# Patient Record
Sex: Male | Born: 1980 | Race: White | Hispanic: No | Marital: Single | Smoking: Current every day smoker
Health system: Southern US, Community
[De-identification: ages and names within clinical notes are randomized; demographics above are authoritative.]

## PROBLEM LIST (undated history)

## (undated) DIAGNOSIS — F1010 Alcohol abuse, uncomplicated: Secondary | ICD-10-CM

## (undated) DIAGNOSIS — T1491XA Suicide attempt, initial encounter: Secondary | ICD-10-CM

## (undated) DIAGNOSIS — F1210 Cannabis abuse, uncomplicated: Secondary | ICD-10-CM

## (undated) DIAGNOSIS — K859 Acute pancreatitis without necrosis or infection, unspecified: Secondary | ICD-10-CM

---
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
## 2012-09-12 DIAGNOSIS — R109 Unspecified abdominal pain: Secondary | ICD-10-CM

## 2012-11-10 ENCOUNTER — Encounter (HOSPITAL_COMMUNITY): Payer: Self-pay

## 2012-11-10 ENCOUNTER — Emergency Department (HOSPITAL_COMMUNITY)
Admission: EM | Admit: 2012-11-10 | Discharge: 2012-11-10 | Disposition: A | Discharge status: Home / Self Care | Payer: Self-pay | Attending: Emergency Medicine | Admitting: Emergency Medicine

## 2012-11-10 ENCOUNTER — Emergency Department (HOSPITAL_COMMUNITY): Payer: Self-pay

## 2012-11-10 DIAGNOSIS — F1010 Alcohol abuse, uncomplicated: Secondary | ICD-10-CM

## 2012-11-10 DIAGNOSIS — M549 Dorsalgia, unspecified: Secondary | ICD-10-CM | POA: Insufficient documentation

## 2012-11-10 DIAGNOSIS — K861 Other chronic pancreatitis: Secondary | ICD-10-CM | POA: Insufficient documentation

## 2012-11-10 DIAGNOSIS — R000 Tachycardia, unspecified: Secondary | ICD-10-CM | POA: Insufficient documentation

## 2012-11-10 DIAGNOSIS — K859 Acute pancreatitis without necrosis or infection, unspecified: Secondary | ICD-10-CM

## 2012-11-10 DIAGNOSIS — F17200 Nicotine dependence, unspecified, uncomplicated: Secondary | ICD-10-CM | POA: Insufficient documentation

## 2012-11-10 DIAGNOSIS — IMO0001 Reserved for inherently not codable concepts without codable children: Secondary | ICD-10-CM | POA: Insufficient documentation

## 2012-11-10 DIAGNOSIS — R112 Nausea with vomiting, unspecified: Secondary | ICD-10-CM | POA: Insufficient documentation

## 2012-11-10 DIAGNOSIS — R109 Unspecified abdominal pain: Secondary | ICD-10-CM | POA: Insufficient documentation

## 2012-11-10 DIAGNOSIS — K5900 Constipation, unspecified: Secondary | ICD-10-CM | POA: Insufficient documentation

## 2012-11-10 HISTORY — DX: Acute pancreatitis without necrosis or infection, unspecified: K85.90

## 2012-11-10 LAB — COMPREHENSIVE METABOLIC PANEL
ALT: 48 U/L — ABNORMAL HIGH (ref 0–35)
AST: 149 U/L — ABNORMAL HIGH (ref 0–37)
Albumin: 4.5 g/dL (ref 3.5–5.2)
Alkaline Phosphatase: 103 U/L (ref 39–117)
CO2: 35 mEq/L — ABNORMAL HIGH (ref 19–32)
Chloride: 97 mEq/L (ref 96–112)
GFR calc non Af Amer: >90 mL/min (ref >90)
Potassium: 2.8 mEq/L — ABNORMAL LOW (ref 3.5–5.1)
Sodium: 144 mEq/L (ref 135–145)
Total Bilirubin: 1 mg/dL (ref 0.3–1.2)

## 2012-11-10 LAB — CBC WITH DIFFERENTIAL/PLATELET
Basophils Absolute: 0.1 10*3/uL (ref 0.0–0.1)
HCT: 41.7 % (ref 36.0–46.0)
Lymphocytes Relative: 54 % — ABNORMAL HIGH (ref 12–46)
Monocytes Absolute: 0.3 10*3/uL (ref 0.1–1.0)
Neutro Abs: 1.8 10*3/uL (ref 1.7–7.7)
Neutrophils Relative %: 35 % — ABNORMAL LOW (ref 43–77)
RDW: 12.9 % (ref 11.5–15.5)
WBC: 5.1 10*3/uL (ref 4.0–10.5)

## 2012-11-10 MED ORDER — SODIUM CHLORIDE 0.9 % IV BOLUS (SEPSIS)
1000.0000 mL | Freq: Once | INTRAVENOUS | Status: AC
  Administered 2012-11-10: 1000 mL via INTRAVENOUS

## 2012-11-10 MED ORDER — PROMETHAZINE HCL 25 MG PO TABS: 25.0000 mg | ORAL_TABLET | Freq: Four times a day (QID) | ORAL | Status: DC | PRN

## 2012-11-10 MED ORDER — THIAMINE HCL 100 MG/ML IJ SOLN
100.0000 mg | Freq: Once | INTRAMUSCULAR | Status: AC
  Administered 2012-11-10: 100 mg via INTRAVENOUS
  Filled 2012-11-10: qty 2

## 2012-11-10 MED ORDER — SODIUM CHLORIDE 0.9 % IV SOLN: 1000.0000 mL | INTRAVENOUS | Status: DC

## 2012-11-10 MED ORDER — RANITIDINE HCL 150 MG PO TABS: 150.0000 mg | ORAL_TABLET | Freq: Two times a day (BID) | ORAL | Status: DC

## 2012-11-10 MED ORDER — PROCHLORPERAZINE EDISYLATE 5 MG/ML IJ SOLN
10.0000 mg | Freq: Once | INTRAMUSCULAR | Status: AC
  Administered 2012-11-10: 10 mg via INTRAVENOUS
  Filled 2012-11-10: qty 2

## 2012-11-10 MED ORDER — SODIUM CHLORIDE 0.9 % IV SOLN: 1000.0000 mL | Freq: Once | INTRAVENOUS | Status: DC

## 2012-11-10 MED ORDER — HYDROCODONE-ACETAMINOPHEN 5-325 MG PO TABS: 1.0000 | ORAL_TABLET | ORAL | Status: DC | PRN

## 2012-11-10 MED ORDER — OMEPRAZOLE 20 MG PO CPDR: 20.0000 mg | DELAYED_RELEASE_CAPSULE | Freq: Every day | ORAL | Status: DC

## 2012-11-10 MED ORDER — LORAZEPAM 2 MG/ML IJ SOLN
1.0000 mg | Freq: Once | INTRAMUSCULAR | Status: AC
  Administered 2012-11-10: 1 mg via INTRAVENOUS
  Filled 2012-11-10: qty 1

## 2012-11-10 MED ORDER — POTASSIUM CHLORIDE CRYS ER 20 MEQ PO TBCR
40.0000 meq | EXTENDED_RELEASE_TABLET | Freq: Once | ORAL | Status: AC
  Administered 2012-11-10: 40 meq via ORAL
  Filled 2012-11-10: qty 2

## 2012-11-10 MED ORDER — FENTANYL CITRATE 0.05 MG/ML IJ SOLN
50.0000 ug | Freq: Once | INTRAMUSCULAR | Status: AC
  Administered 2012-11-10: 50 ug via INTRAVENOUS
  Filled 2012-11-10: qty 2
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
## 2012-11-10 NOTE — ED Provider Notes (Signed)
History     CSN: [id number]  Arrival date & time 11/10/12  4098   First MD Initiated Contact with Patient 11/10/12 [phone]      Chief Complaint  Patient presents with  . Pancreatitis    (Consider location/radiation/quality/duration/timing/severity/associated sxs/prior treatment) HPI Comments: Pt states he drinks a lot or alcohol. Pain worse during these episodes.  Patient is a 32 y.o. male presenting with abdominal pain. The history is provided by the patient.  Abdominal Pain This is a chronic problem. The current episode started more than 1 month ago. The problem occurs intermittently. Associated symptoms include abdominal pain, myalgias, nausea and vomiting. Pertinent negatives include no arthralgias, chest pain, coughing or neck pain. Associated symptoms comments: constipation. The symptoms are aggravated by drinking, eating and walking. She has tried nothing for the symptoms. The treatment provided no relief.    Past Medical History  Diagnosis Date  . Pancreatitis     History reviewed. No pertinent past surgical history.  No family history on file.  History  Substance Use Topics  . Smoking status: Current Every Day Smoker    Types: Cigarettes  . Smokeless tobacco: Not on file  . Alcohol Use: Yes     Comment: daily    OB History   Grav Para Term Preterm Abortions TAB SAB Ect Mult Living                  Review of Systems  Constitutional: Negative for activity change.       All ROS Neg except as noted in HPI  HENT: Negative for nosebleeds and neck pain.   Eyes: Negative for photophobia and discharge.  Respiratory: Negative for cough, shortness of breath and wheezing.   Cardiovascular: Negative for chest pain and palpitations.  Gastrointestinal: Positive for nausea, vomiting, abdominal pain and constipation. Negative for blood in stool.  Genitourinary: Negative for dysuria, frequency and hematuria.  Musculoskeletal: Positive for myalgias and back pain. Negative  for arthralgias.  Skin: Negative.   Neurological: Negative for dizziness, seizures and speech difficulty.  Psychiatric/Behavioral: Negative for hallucinations and confusion.    Allergies  Review of patient's allergies indicates no known allergies.  Home Medications  No current outpatient prescriptions on file.  BP 108/68  Pulse 108  Temp(Src) 98.3 F (36.8 C) (Oral)  Resp 18  Ht 6\' 2"  (1.88 m)  Wt 160 lb (72.576 kg)  BMI 20.53 kg/m2  SpO2 93%  Physical Exam  Cardiovascular: Tachycardia present.   Abdominal: She exhibits no ascites and no mass. Bowel sounds are decreased. There is tenderness in the right upper quadrant and epigastric area. There is guarding and CVA tenderness.    ED Course  Procedures (including critical care time)  Labs Reviewed  CBC WITH DIFFERENTIAL  COMPREHENSIVE METABOLIC PANEL  LIPASE, BLOOD  URINALYSIS, ROUTINE W REFLEX MICROSCOPIC   No results found.   No diagnosis found.    MDM  I have reviewed nursing notes, vital signs, and all appropriate lab and imaging results for this patient. Patient presents to the emergency apartment with a history of pancreatitis. He has been in and out of the [facility] hospital over the last 3 months or related to this problem. Patient also states that he takes in a" large" amount of alcohol. Patient presents to the emergency department today because of 2-3 days of severe pain and vomiting. His been no hemoptysis reported. The patient denies any suicidal or homicidal ideations.  The pulse was elevated at 108 and this has been  addressed with IV fluids. The complete blood count reveals the hemoglobin to be 15.1 which is elevated the platelets are low at 112,000. The remainder is within normal limits. Comprehensive metabolic panel reveals the potassium to be low at 2.8, the CO2 to be elevated at 35, the glucose is elevated at 120, the AST elevated at 149, and the ALT elevated at 48. The lipase is elevated at 135. Chest  x-ray reveals no definite cardiopulmonary disease.  Labs discussed with the patient. The patient states he feels that he can manage this at home at this time. He does not want to be admitted to any form of detox. Patient states that he can" handle it myself". The patient again denies any suicidal or homicidal ideations.  The plan at this time is for the patient to be placed on promethazine every 6 hours, Norco every 4 hours as needed for pain #20 tablets. Prescription for Prilosec and Zantac also given. Patient referred to Dr. Darrick Penna for GI evaluation and management.       Kathie Dike, PA-C 11/10/12 [phone]

## 2012-11-10 NOTE — ED Provider Notes (Signed)
Medical screening examination/treatment/procedure(s) were performed by non-physician practitioner and as supervising physician I was immediately available for consultation/collaboration.  Hurman Horn, MD 11/10/12 2249

## 2012-11-10 NOTE — ED Notes (Signed)
Pt reports havign episodes of pancreatitis for the last 3 months, has been in the hosp (at [facility] ago and left), has been told to quit drinking etoh. And is unable. Reports severe pain and vomiting for 2 days.

## 2013-01-22 DIAGNOSIS — R109 Unspecified abdominal pain: Secondary | ICD-10-CM

## 2016-02-19 DIAGNOSIS — T1491XA Suicide attempt, initial encounter: Secondary | ICD-10-CM

## 2016-02-19 HISTORY — DX: Suicide attempt, initial encounter: T14.91XA

## 2016-03-07 ENCOUNTER — Emergency Department (HOSPITAL_COMMUNITY): Payer: Self-pay

## 2016-03-07 ENCOUNTER — Encounter (HOSPITAL_COMMUNITY): Payer: Self-pay | Admitting: *Deleted

## 2016-03-07 ENCOUNTER — Emergency Department (HOSPITAL_COMMUNITY)
Admission: EM | Admit: 2016-03-07 | Discharge: 2016-03-08 | Disposition: A | Discharge status: Home / Self Care | Payer: Self-pay | Attending: Emergency Medicine | Admitting: Emergency Medicine

## 2016-03-07 DIAGNOSIS — Y999 Unspecified external cause status: Secondary | ICD-10-CM | POA: Insufficient documentation

## 2016-03-07 DIAGNOSIS — F17210 Nicotine dependence, cigarettes, uncomplicated: Secondary | ICD-10-CM | POA: Insufficient documentation

## 2016-03-07 DIAGNOSIS — Y9389 Activity, other specified: Secondary | ICD-10-CM | POA: Insufficient documentation

## 2016-03-07 DIAGNOSIS — Y929 Unspecified place or not applicable: Secondary | ICD-10-CM | POA: Insufficient documentation

## 2016-03-07 DIAGNOSIS — S2241XA Multiple fractures of ribs, right side, initial encounter for closed fracture: Secondary | ICD-10-CM | POA: Insufficient documentation

## 2016-03-07 MED ORDER — KETOROLAC TROMETHAMINE 30 MG/ML IJ SOLN
30.0000 mg | Freq: Once | INTRAMUSCULAR | Status: AC
  Administered 2016-03-07: 30 mg via INTRAVENOUS
  Filled 2016-03-07: qty 1
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
## 2016-03-07 NOTE — ED Notes (Signed)
Pt has been wanded and cleared by security.

## 2016-03-08 MED ORDER — CYCLOBENZAPRINE HCL 5 MG PO TABS: 5.0000 mg | ORAL_TABLET | Freq: Three times a day (TID) | ORAL | 0 refills | Status: DC | PRN

## 2016-03-08 MED ORDER — PERCOCET 5-325 MG PO TABS: 1.0000 | ORAL_TABLET | Freq: Four times a day (QID) | ORAL | 0 refills | Status: DC | PRN

## 2016-03-08 MED ORDER — DIAZEPAM 5 MG PO TABS
5.0000 mg | ORAL_TABLET | Freq: Once | ORAL | Status: AC
  Administered 2016-03-08: 5 mg via ORAL
  Filled 2016-03-08: qty 1

## 2016-03-08 MED ORDER — OXYCODONE-ACETAMINOPHEN 5-325 MG PO TABS
1.0000 | ORAL_TABLET | Freq: Once | ORAL | Status: AC
  Administered 2016-03-08: 1 via ORAL
  Filled 2016-03-08: qty 1

## 2016-03-08 NOTE — Discharge Instructions (Signed)
Ice packs to the injured areas. Take the pain medications as prescribed. Return to the ED if you cough up blood, struggle to breathe, get a fever or cough.

## 2016-03-08 NOTE — ED Notes (Signed)
Pt given peanut butter and graham crackers and ginger ale
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
## 2016-03-09 ENCOUNTER — Emergency Department (HOSPITAL_COMMUNITY)
Admission: EM | Admit: 2016-03-09 | Discharge: 2016-03-10 | Disposition: A | Discharge status: Other Acute Inpatient Hospital | Payer: Self-pay | Attending: Emergency Medicine | Admitting: Emergency Medicine

## 2016-03-09 ENCOUNTER — Encounter (HOSPITAL_COMMUNITY): Payer: Self-pay | Admitting: *Deleted

## 2016-03-09 ENCOUNTER — Emergency Department (HOSPITAL_COMMUNITY): Payer: Self-pay

## 2016-03-09 DIAGNOSIS — X748XXA Intentional self-harm by other firearm discharge, initial encounter: Secondary | ICD-10-CM | POA: Insufficient documentation

## 2016-03-09 DIAGNOSIS — S21102A Unspecified open wound of left front wall of thorax without penetration into thoracic cavity, initial encounter: Secondary | ICD-10-CM | POA: Insufficient documentation

## 2016-03-09 DIAGNOSIS — W3400XA Accidental discharge from unspecified firearms or gun, initial encounter: Secondary | ICD-10-CM

## 2016-03-09 DIAGNOSIS — Y999 Unspecified external cause status: Secondary | ICD-10-CM | POA: Insufficient documentation

## 2016-03-09 DIAGNOSIS — T1491XA Suicide attempt, initial encounter: Secondary | ICD-10-CM

## 2016-03-09 DIAGNOSIS — Y929 Unspecified place or not applicable: Secondary | ICD-10-CM | POA: Insufficient documentation

## 2016-03-09 DIAGNOSIS — Y939 Activity, unspecified: Secondary | ICD-10-CM | POA: Insufficient documentation

## 2016-03-09 DIAGNOSIS — Z5181 Encounter for therapeutic drug level monitoring: Secondary | ICD-10-CM | POA: Insufficient documentation

## 2016-03-09 DIAGNOSIS — S21332A Puncture wound without foreign body of left front wall of thorax with penetration into thoracic cavity, initial encounter: Secondary | ICD-10-CM

## 2016-03-09 HISTORY — DX: Cannabis abuse, uncomplicated: F12.10

## 2016-03-09 HISTORY — DX: Alcohol abuse, uncomplicated: F10.10

## 2016-03-09 HISTORY — DX: Suicide attempt, initial encounter: T14.91XA

## 2016-03-09 LAB — COMPREHENSIVE METABOLIC PANEL
ALBUMIN: 3.8 g/dL (ref 3.5–5.0)
ALK PHOS: 88 U/L (ref 38–126)
ALT: 46 U/L (ref 17–63)
ANION GAP: 11 (ref 5–15)
AST: 85 U/L — ABNORMAL HIGH (ref 15–41)
BILIRUBIN TOTAL: 1.3 mg/dL — AB (ref 0.3–1.2)
BUN: 8 mg/dL (ref 6–20)
CALCIUM: 9.4 mg/dL (ref 8.9–10.3)
CO2: 24 mmol/L (ref 22–32)
Chloride: 101 mmol/L (ref 101–111)
Creatinine, Ser: 1.03 mg/dL (ref 0.61–1.24)
GFR calc non Af Amer: >60 mL/min (ref >60)
GLUCOSE: 100 mg/dL — AB (ref 65–99)
Potassium: 3.1 mmol/L — ABNORMAL LOW (ref 3.5–5.1)
Sodium: 136 mmol/L (ref 135–145)
TOTAL PROTEIN: 7.3 g/dL (ref 6.5–8.1)

## 2016-03-09 LAB — CBC WITH DIFFERENTIAL/PLATELET
BASOS PCT: 1 %
Basophils Absolute: 0.1 10*3/uL (ref 0.0–0.1)
EOS ABS: 0.1 10*3/uL (ref 0.0–0.7)
Eosinophils Relative: 1 %
HCT: 36.4 % — ABNORMAL LOW (ref 39.0–52.0)
HEMOGLOBIN: 12.2 g/dL — AB (ref 13.0–17.0)
Lymphocytes Relative: 34 %
Lymphs Abs: 3 10*3/uL (ref 0.7–4.0)
MCH: 32.2 pg (ref 26.0–34.0)
MCHC: 33.5 g/dL (ref 30.0–36.0)
MCV: 96 fL (ref 78.0–100.0)
MONOS PCT: 11 %
Monocytes Absolute: 1 10*3/uL (ref 0.1–1.0)
NEUTROS PCT: 53 %
Neutro Abs: 4.8 10*3/uL (ref 1.7–7.7)
PLATELETS: 242 10*3/uL (ref 150–400)
RBC: 3.79 MIL/uL — ABNORMAL LOW (ref 4.22–5.81)
RDW: 13.1 % (ref 11.5–15.5)
WBC: 9 10*3/uL (ref 4.0–10.5)

## 2016-03-09 LAB — URINE MICROSCOPIC-ADD ON: RBC / HPF: NONE SEEN RBC/hpf (ref 0–5)

## 2016-03-09 LAB — PREPARE FRESH FROZEN PLASMA
UNIT DIVISION: 0
Unit division: 0

## 2016-03-09 LAB — URINALYSIS, ROUTINE W REFLEX MICROSCOPIC
GLUCOSE, UA: NEGATIVE mg/dL
Hgb urine dipstick: NEGATIVE
Ketones, ur: 15 mg/dL — AB
Nitrite: NEGATIVE
PH: 5.5 (ref 5.0–8.0)
Protein, ur: 30 mg/dL — AB
SPECIFIC GRAVITY, URINE: 1.017 (ref 1.005–1.030)

## 2016-03-09 LAB — RAPID URINE DRUG SCREEN, HOSP PERFORMED
Amphetamines: NOT DETECTED
Barbiturates: NOT DETECTED
Benzodiazepines: POSITIVE — AB
COCAINE: NOT DETECTED
OPIATES: NOT DETECTED
TETRAHYDROCANNABINOL: POSITIVE — AB

## 2016-03-09 LAB — SALICYLATE LEVEL: Salicylate Lvl: 15.5 mg/dL (ref 2.8–30.0)

## 2016-03-09 LAB — ETHANOL: Alcohol, Ethyl (B): 215 mg/dL — ABNORMAL HIGH (ref <5)

## 2016-03-09 LAB — ACETAMINOPHEN LEVEL: Acetaminophen (Tylenol), Serum: <10 ug/mL — ABNORMAL LOW (ref 10–30)

## 2016-03-09 LAB — ABO/RH: ABO/RH(D): O NEG

## 2016-03-09 MED ORDER — IOPAMIDOL (ISOVUE-300) INJECTION 61%
INTRAVENOUS | Status: AC
  Filled 2016-03-09: qty 75

## 2016-03-09 MED ORDER — LORAZEPAM 2 MG/ML IJ SOLN
0.0000 mg | Freq: Four times a day (QID) | INTRAMUSCULAR | Status: DC
  Administered 2016-03-09: 2 mg via INTRAVENOUS
  Filled 2016-03-09: qty 1

## 2016-03-09 MED ORDER — THIAMINE HCL 100 MG/ML IJ SOLN: 100.0000 mg | Freq: Every day | INTRAMUSCULAR | Status: DC

## 2016-03-09 MED ORDER — HYDROCODONE-ACETAMINOPHEN 5-325 MG PO TABS
1.0000 | ORAL_TABLET | Freq: Four times a day (QID) | ORAL | Status: DC | PRN
  Administered 2016-03-10 (×2): 1 via ORAL
  Filled 2016-03-09 (×3): qty 1

## 2016-03-09 MED ORDER — HYDROCODONE-ACETAMINOPHEN 5-325 MG PO TABS
1.0000 | ORAL_TABLET | Freq: Once | ORAL | Status: AC
  Administered 2016-03-09: 1 via ORAL
  Filled 2016-03-09: qty 1

## 2016-03-09 MED ORDER — VITAMIN B-1 100 MG PO TABS
100.0000 mg | ORAL_TABLET | Freq: Every day | ORAL | Status: DC
  Administered 2016-03-09 – 2016-03-10 (×2): 100 mg via ORAL
  Filled 2016-03-09 (×3): qty 1

## 2016-03-09 MED ORDER — MORPHINE SULFATE (PF) 4 MG/ML IV SOLN
4.0000 mg | Freq: Once | INTRAVENOUS | Status: AC
  Administered 2016-03-09: 4 mg via INTRAVENOUS
  Filled 2016-03-09: qty 1

## 2016-03-09 MED ORDER — LORAZEPAM 2 MG/ML IJ SOLN: 0.0000 mg | Freq: Two times a day (BID) | INTRAMUSCULAR | Status: DC
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
## 2016-03-09 NOTE — ED Notes (Signed)
[employer] to be called upon discharge due to warrant

## 2016-03-09 NOTE — ED Notes (Signed)
Resting quietly with no distress noted.

## 2016-03-09 NOTE — ED Notes (Signed)
No belongings with patient.  Everything sent with PD as evidence.
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
## 2016-03-09 NOTE — ED Notes (Signed)
tts assessment completed

## 2016-03-10 ENCOUNTER — Encounter (HOSPITAL_COMMUNITY): Payer: Self-pay | Admitting: *Deleted

## 2016-03-10 ENCOUNTER — Inpatient Hospital Stay (HOSPITAL_COMMUNITY)
Admission: AD | Admit: 2016-03-10 | Discharge: 2016-03-20 | DRG: 885 | Payer: No Typology Code available for payment source | Attending: Psychiatry | Admitting: Psychiatry

## 2016-03-10 ENCOUNTER — Encounter (HOSPITAL_COMMUNITY): Payer: Self-pay

## 2016-03-10 ENCOUNTER — Inpatient Hospital Stay (HOSPITAL_COMMUNITY): Admission: AD | Admit: 2016-03-10 | Payer: Self-pay | Admitting: Psychiatry

## 2016-03-10 DIAGNOSIS — F5105 Insomnia due to other mental disorder: Secondary | ICD-10-CM | POA: Diagnosis present

## 2016-03-10 DIAGNOSIS — F1210 Cannabis abuse, uncomplicated: Secondary | ICD-10-CM | POA: Diagnosis present

## 2016-03-10 DIAGNOSIS — F322 Major depressive disorder, single episode, severe without psychotic features: Principal | ICD-10-CM | POA: Diagnosis present

## 2016-03-10 DIAGNOSIS — Z915 Personal history of self-harm: Secondary | ICD-10-CM | POA: Diagnosis not present

## 2016-03-10 DIAGNOSIS — F1094 Alcohol use, unspecified with alcohol-induced mood disorder: Secondary | ICD-10-CM | POA: Diagnosis present

## 2016-03-10 DIAGNOSIS — G4700 Insomnia, unspecified: Secondary | ICD-10-CM | POA: Diagnosis present

## 2016-03-10 DIAGNOSIS — S21339A Puncture wound without foreign body of unspecified front wall of thorax with penetration into thoracic cavity, initial encounter: Secondary | ICD-10-CM

## 2016-03-10 DIAGNOSIS — S2249XD Multiple fractures of ribs, unspecified side, subsequent encounter for fracture with routine healing: Secondary | ICD-10-CM

## 2016-03-10 DIAGNOSIS — F1994 Other psychoactive substance use, unspecified with psychoactive substance-induced mood disorder: Secondary | ICD-10-CM | POA: Diagnosis not present

## 2016-03-10 DIAGNOSIS — F1020 Alcohol dependence, uncomplicated: Secondary | ICD-10-CM | POA: Diagnosis not present

## 2016-03-10 DIAGNOSIS — Z23 Encounter for immunization: Secondary | ICD-10-CM

## 2016-03-10 DIAGNOSIS — Z79899 Other long term (current) drug therapy: Secondary | ICD-10-CM | POA: Diagnosis not present

## 2016-03-10 DIAGNOSIS — F1194 Opioid use, unspecified with opioid-induced mood disorder: Secondary | ICD-10-CM | POA: Diagnosis present

## 2016-03-10 DIAGNOSIS — F17210 Nicotine dependence, cigarettes, uncomplicated: Secondary | ICD-10-CM | POA: Diagnosis present

## 2016-03-10 DIAGNOSIS — F0630 Mood disorder due to known physiological condition, unspecified: Secondary | ICD-10-CM | POA: Diagnosis not present

## 2016-03-10 DIAGNOSIS — W3400XA Accidental discharge from unspecified firearms or gun, initial encounter: Secondary | ICD-10-CM

## 2016-03-10 DIAGNOSIS — R45851 Suicidal ideations: Secondary | ICD-10-CM | POA: Diagnosis not present

## 2016-03-10 LAB — TYPE AND SCREEN
ABO/RH(D): O NEG
Antibody Screen: NEGATIVE
UNIT DIVISION: 0
Unit division: 0

## 2016-03-10 MED ORDER — LOPERAMIDE HCL 2 MG PO CAPS
2.0000 mg | ORAL_CAPSULE | ORAL | Status: AC | PRN
Start: 2016-03-10 — End: 2016-03-13

## 2016-03-10 MED ORDER — ENSURE ENLIVE PO LIQD: 237.0000 mL | Freq: Two times a day (BID) | ORAL | Status: DC

## 2016-03-10 MED ORDER — POTASSIUM CHLORIDE CRYS ER 20 MEQ PO TBCR
40.0000 meq | EXTENDED_RELEASE_TABLET | Freq: Once | ORAL | Status: AC
  Administered 2016-03-10: 40 meq via ORAL
  Filled 2016-03-10: qty 2

## 2016-03-10 MED ORDER — NICOTINE 21 MG/24HR TD PT24
21.0000 mg | MEDICATED_PATCH | Freq: Every day | TRANSDERMAL | Status: DC
  Administered 2016-03-11 – 2016-03-20 (×9): 21 mg via TRANSDERMAL
  Filled 2016-03-10 (×13): qty 1

## 2016-03-10 MED ORDER — ADULT MULTIVITAMIN W/MINERALS CH
1.0000 | ORAL_TABLET | Freq: Every day | ORAL | Status: DC
  Administered 2016-03-10 – 2016-03-20 (×11): 1 via ORAL
  Filled 2016-03-10 (×17): qty 1

## 2016-03-10 MED ORDER — LORAZEPAM 1 MG PO TABS
0.0000 mg | ORAL_TABLET | Freq: Four times a day (QID) | ORAL | Status: DC
  Administered 2016-03-10: 1 mg via ORAL
  Filled 2016-03-10: qty 1

## 2016-03-10 MED ORDER — LORAZEPAM 1 MG PO TABS: 0.0000 mg | ORAL_TABLET | Freq: Two times a day (BID) | ORAL | Status: DC

## 2016-03-10 MED ORDER — CHLORDIAZEPOXIDE HCL 25 MG PO CAPS
25.0000 mg | ORAL_CAPSULE | Freq: Three times a day (TID) | ORAL | Status: AC
Start: 2016-03-12 — End: 2016-03-12
  Administered 2016-03-12 (×3): 25 mg via ORAL
  Filled 2016-03-10 (×3): qty 1

## 2016-03-10 MED ORDER — MAGNESIUM HYDROXIDE 400 MG/5ML PO SUSP: 30.0000 mL | Freq: Every day | ORAL | Status: DC | PRN

## 2016-03-10 MED ORDER — ACETAMINOPHEN 325 MG PO TABS
650.0000 mg | ORAL_TABLET | Freq: Four times a day (QID) | ORAL | Status: DC | PRN
  Administered 2016-03-11 – 2016-03-18 (×11): 650 mg via ORAL
  Filled 2016-03-10 (×12): qty 2

## 2016-03-10 MED ORDER — IBUPROFEN 600 MG PO TABS
600.0000 mg | ORAL_TABLET | Freq: Four times a day (QID) | ORAL | Status: DC | PRN
  Administered 2016-03-11 – 2016-03-13 (×6): 600 mg via ORAL
  Filled 2016-03-10 (×6): qty 1

## 2016-03-10 MED ORDER — HYDROCODONE-ACETAMINOPHEN 5-325 MG PO TABS
1.0000 | ORAL_TABLET | Freq: Four times a day (QID) | ORAL | Status: AC | PRN
  Administered 2016-03-10 – 2016-03-12 (×7): 1 via ORAL
  Filled 2016-03-10 (×7): qty 1

## 2016-03-10 MED ORDER — VITAMIN B-1 100 MG PO TABS
100.0000 mg | ORAL_TABLET | Freq: Every day | ORAL | Status: DC
Start: 2016-03-11 — End: 2016-03-20
  Administered 2016-03-11 – 2016-03-20 (×10): 100 mg via ORAL
  Filled 2016-03-10: qty 1
  Filled 2016-03-10: qty 21
  Filled 2016-03-10 (×13): qty 1

## 2016-03-10 MED ORDER — TRAZODONE HCL 50 MG PO TABS
50.0000 mg | ORAL_TABLET | Freq: Every evening | ORAL | Status: DC | PRN
  Administered 2016-03-11 – 2016-03-17 (×7): 50 mg via ORAL
  Filled 2016-03-10: qty 21
  Filled 2016-03-10 (×7): qty 1

## 2016-03-10 MED ORDER — ALUM & MAG HYDROXIDE-SIMETH 200-200-20 MG/5ML PO SUSP: 30.0000 mL | ORAL | Status: DC | PRN

## 2016-03-10 MED ORDER — CHLORDIAZEPOXIDE HCL 25 MG PO CAPS
25.0000 mg | ORAL_CAPSULE | Freq: Four times a day (QID) | ORAL | Status: AC | PRN
  Administered 2016-03-11 – 2016-03-12 (×4): 25 mg via ORAL
  Filled 2016-03-10 (×4): qty 1

## 2016-03-10 MED ORDER — ONDANSETRON 4 MG PO TBDP
4.0000 mg | ORAL_TABLET | Freq: Four times a day (QID) | ORAL | Status: AC | PRN
  Administered 2016-03-12: 4 mg via ORAL
  Filled 2016-03-10: qty 1

## 2016-03-10 MED ORDER — CHLORDIAZEPOXIDE HCL 25 MG PO CAPS
25.0000 mg | ORAL_CAPSULE | Freq: Four times a day (QID) | ORAL | Status: AC
  Administered 2016-03-10 – 2016-03-11 (×6): 25 mg via ORAL
  Filled 2016-03-10 (×6): qty 1

## 2016-03-10 MED ORDER — CHLORDIAZEPOXIDE HCL 25 MG PO CAPS
25.0000 mg | ORAL_CAPSULE | Freq: Every day | ORAL | Status: AC
  Administered 2016-03-14: 25 mg via ORAL
  Filled 2016-03-10: qty 1

## 2016-03-10 MED ORDER — HYDROXYZINE HCL 25 MG PO TABS
25.0000 mg | ORAL_TABLET | Freq: Four times a day (QID) | ORAL | Status: AC | PRN
  Administered 2016-03-11 (×3): 25 mg via ORAL
  Filled 2016-03-10 (×3): qty 1

## 2016-03-10 MED ORDER — INFLUENZA VAC SPLIT QUAD 0.5 ML IM SUSY
0.5000 mL | PREFILLED_SYRINGE | INTRAMUSCULAR | Status: AC
  Administered 2016-03-14: 0.5 mL via INTRAMUSCULAR
  Filled 2016-03-10: qty 0.5

## 2016-03-10 MED ORDER — CHLORDIAZEPOXIDE HCL 25 MG PO CAPS
25.0000 mg | ORAL_CAPSULE | ORAL | Status: AC
Start: 2016-03-13 — End: 2016-03-13
  Administered 2016-03-13 (×2): 25 mg via ORAL
  Filled 2016-03-10 (×2): qty 1

## 2016-03-10 NOTE — Progress Notes (Signed)
Patient did not attend the evening speaker AA meeting. Pt was newly admitted to unit and was asleep during group time.   
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
## 2016-03-10 NOTE — Tx Team (Signed)
Initial Treatment Plan 03/10/2016 7:45 PM Evette DoffingJimmy Huegel NWG:[id number]    PATIENT STRESSORS: Marital or family conflict Substance abuse   PATIENT STRENGTHS: Average or above average intelligence Communication skills General fund of knowledge   PATIENT IDENTIFIED PROBLEMS: At risk for suicide  Depression  "I don't even want to be here"  "I have no one and no support"               DISCHARGE CRITERIA:  Ability to meet basic life and health needs Adequate post-discharge living arrangements Improved stabilization in mood, thinking, and/or behavior Motivation to continue treatment in a less acute level of care Need for constant or close observation no longer present Verbal commitment to aftercare and medication compliance Withdrawal symptoms are absent or subacute and managed without 24-hour nursing intervention  PRELIMINARY DISCHARGE PLAN: Attend 12-step recovery group Outpatient therapy Return to previous living arrangement Return to previous work or school arrangements  PATIENT/FAMILY INVOLVEMENT: This treatment plan has been presented to and reviewed with the patient, Evette DoffingJimmy Isais.  The patient and family have been given the opportunity to ask questions and make suggestions.  Carleene OverlieMiddleton, Samy Ryner P, RN 03/10/2016, 7:45 PM

## 2016-03-10 NOTE — Progress Notes (Signed)
D- Patient is depressed, anxious, with flat affect. Patient was observed resting in the bed at beginning of shift.  Patient got up and took medications with no issues.  Patient endorses SI, HI,  and pain.  Patient states "I don't feel nothing", "I don't want to be in this world period".  Patient verbally contracts for safety stating that he will notify a nurse if he feels like he cannot control his suicidal thought and have intent to carry out a plan.  Patient has complaints of rib pain and was given medication per MD order. See MAR.   A- Scheduled medications administered to patient, per MD orders. Support and encouragement provided.  Routine safety checks conducted every 15 minutes.  Patient informed to notify staff with problems or concerns. R- No adverse drug reactions noted.  Patient compliant with medications and treatment plan. Patient receptive, calm, and cooperative.  Patient remains safe at this time.

## 2016-03-10 NOTE — Progress Notes (Signed)
Report accepted from admitting RN.  Patient observed in the bed resting. Introduced self to patient.  Patient oriented to the unit and verbalizes understanding of unit rules.  Patient appears in no distress.  Patient remains safe at this time.

## 2016-03-10 NOTE — ED Notes (Signed)
Will administer 1000 po med when pt awakens.

## 2016-03-10 NOTE — ED Provider Notes (Signed)
Ativan for CIWA ordered IV only.  Pt without IV.  Order set changed to PO ativan for administration per CIWA protocol.     Dahlia ClientHannah Audon Heymann, PA-C 03/10/16 [id number]    Shane OharaJoshua Zavitz, MD 03/10/16 1537

## 2016-03-10 NOTE — ED Notes (Signed)
Dr Donnald GarrePfeiffer aware pt [facility] - Dr Mckinley Jewelates - will need Potassium po d/t Potassium 3.1.

## 2016-03-11 DIAGNOSIS — R45851 Suicidal ideations: Secondary | ICD-10-CM

## 2016-03-11 DIAGNOSIS — F0630 Mood disorder due to known physiological condition, unspecified: Secondary | ICD-10-CM

## 2016-03-11 DIAGNOSIS — F1994 Other psychoactive substance use, unspecified with psychoactive substance-induced mood disorder: Secondary | ICD-10-CM

## 2016-03-11 MED ORDER — ENSURE ENLIVE PO LIQD: 237.0000 mL | Freq: Three times a day (TID) | ORAL | Status: DC
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
## 2016-03-11 NOTE — Progress Notes (Signed)
NUTRITION ASSESSMENT  Pt identified as at risk on the Malnutrition Screen Tool  INTERVENTION: 1. Supplements: Ensure Enlive po TID, each supplement provides 350 kcal and 20 grams of protein  NUTRITION DIAGNOSIS: Unintentional weight loss related to sub-optimal intake as evidenced by pt report.   Goal: Pt to meet >/= 90% of their estimated nutrition needs.  Monitor:  PO intake  Assessment:  Pt admitted with depression. PMHx of pancreatitis. Per chart review, pt has lost 15 lb. Pt is underweight. Pt has been ordered Ensure supplements BID, will increase to TID.   Height: Ht Readings from Last 1 Encounters:  03/10/16 6\' 1"  (1.854 m)    Weight: Wt Readings from Last 1 Encounters:  03/10/16 120 lb (54.4 kg)    Weight Hx: Wt Readings from Last 10 Encounters:  03/10/16 120 lb (54.4 kg)  03/07/16 135 lb (61.2 kg)  11/10/12 160 lb (72.6 kg)    BMI:  Body mass index is 15.83 kg/m. Pt meets criteria for underweight based on current BMI.  Estimated Nutritional Needs: Kcal: 25-30 kcal/kg Protein: > 1 gram protein/kg Fluid: 1 ml/kcal  Diet Order: Diet regular Room service appropriate? Yes; Fluid consistency: Thin Pt is also offered choice of unit snacks mid-morning and mid-afternoon.  Pt is eating as desired.   Lab results and medications reviewed.   Tilda FrancoLindsey Yazan Gatling, MS, RD, LDN Pager: [phone] After Hours Pager: [phone]

## 2016-03-11 NOTE — [facility] Group Notes (Signed)
  [facility] LCSW Group Therapy  03/11/2016  At 10:10 until 11:00 AM   Type of Therapy:  Group Therapy  Participation Level: Did not attend; invited to participate yet did not despite overhead announcement and encouragement by staff   Catherine C Harrill, LCSW 
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
## 2016-03-11 NOTE — Progress Notes (Signed)
Patient ID: Shane DoffingJimmy Morrow, male   DOB: 06/23/1980, 35 y.o.   MRN: [id number]    D: Pt has been very agitated and irritable on the unit today. Pt reported that he felt that staff were doing nothing to help him, and that he needed more medication. Pt reported that what he was getting was not enough, and that he needed to talk to somebody. Pt did not attend any groups nor did he engage in any treatment. Pt reported that his depression was a 8, his hopelessness was a 10, and his anxiety was a 5. Pt reported that he was in extreme pain and that he was having lots of withdrawals. Pt required and requested medication every four hours for withdrawals and pain. Pt reported that he was positive SI, but was able to contract for safety. Pt reported being negative HI, no AH/VH noted. A: 15 min checks continued for patient safety. R: Pt safety maintained.

## 2016-03-11 NOTE — [facility] Counselor (Signed)
CSW made 2nd attempt in order to complete PSA. Patient remained on the phone after CSW requested to complete PSA. CSW will continue to follow.

## 2016-03-11 NOTE — Progress Notes (Signed)
Patient did attend the evening speaker AA meeting.  

## 2016-03-11 NOTE — [facility] Group Notes (Signed)
[facility] Group Notes:  (Nursing/MHT/Case Management/Adjunct)  Date:  03/11/2016  Time:  10:24 AM  Type of Therapy:  Psychoeducational Skills  Participation Level:  Did Not Attend  Participation Quality:  Did Not Attend  Affect:  Did Not Attend  Cognitive:  Did Not Attend  Insight:  None  Engagement in Group:  Did Not Attend  Modes of Intervention:  Did Not Attend  Summary of Progress/Problems: Pt did not attend patient self inventory group.   Jacquelyne BalintForrest, Jaymz Traywick Shanta 03/11/2016, 10:24 AM

## 2016-03-11 NOTE — [facility] Suicide Risk Assessment (Signed)
[facility]   Nursing information obtained from:    Demographic factors:    Current Mental Status:    Loss Factors:    Historical Factors:    Risk Reduction Factors:     Total Time spent with patient: 1 hour Principal Problem: <principal problem not specified> Diagnosis:   Patient Active Problem List   Diagnosis Date Noted  . MDD (major depressive disorder), single episode [F32.9] 03/10/2016   Subjective Data: lying in bed, mood angry and "no body cares for me"  Continued Clinical Symptoms:    The "Alcohol Use Disorders Identification Test", Guidelines for Use in Primary Care, Second Edition.  World Science writerHealth Organization [facility](WHO). Score between 0-7:  no or low risk or alcohol related problems. Score between 8-15:  moderate risk of alcohol related problems. Score between 16-19:  high risk of alcohol related problems. Score 20 or above:  warrants further diagnostic evaluation for alcohol dependence and treatment.   CLINICAL FACTORS:   Depression:   Anhedonia Hopelessness Impulsivity Alcohol/Substance Abuse/Dependencies More than one psychiatric diagnosis Unstable or Poor Therapeutic Relationship   Musculoskeletal: Strength & Muscle Tone: within normal limits Gait & Station: lying in bed Patient leans: forward  Psychiatric Specialty Exam: Physical Exam  Review of Systems  Cardiovascular: Negative for chest pain.  Neurological: Negative for tremors.  Psychiatric/Behavioral: Positive for depression and substance abuse. The patient is nervous/anxious.     Blood pressure 107/69, pulse (!) 49, temperature 98.3 F (36.8 C), temperature source Oral, resp. rate 15, height 6\' 1"  (1.854 m), weight 54.4 kg (120 lb), SpO2 100 %.Body mass index is 15.83 kg/m.  General Appearance: Disheveled  Eye Contact:  Minimal  Speech:  Slow  Volume:  Decreased  Mood:  Angry and Irritable  Affect:  Congruent  Thought Process:  Goal Directed  Orientation:  Full (Time,  Place, and Person)  Thought Content:  Rumination  Suicidal Thoughts:  Yes.  without intent/plan  Homicidal Thoughts:  No  Memory:  Immediate;   Fair Recent;   Fair  Judgement:  Poor  Insight:  Shallow  Psychomotor Activity:  Decreased  Concentration:  Concentration: Fair and Attention Span: Fair  Recall:  Fis[employer]Fair  Fund of Knowledge:  Fair  Language:  Fair  Akathisia:  Negative  Handed:  Right  AIMS (if indicated):     Assets:  Others:  limited  ADL's:  Intact  Cognition:  WNL  Sleep:  Number of Hours: 3.5      COGNITIVE FEATURES THAT CONTRIBUTE TO RISK:  Closed-mindedness    SUICIDE RISK:   Severe:  Frequent, intense, and enduring suicidal ideation, specific plan, no subjective intent, but some objective markers of intent (i.e., choice of lethal method), the method is accessible, some limited preparatory behavior, evidence of impaired self-control, severe dysphoria/symptomatology, multiple risk factors present, and few if any protective factors, particularly a lack of social support.   PLAN OF CARE: admit for stabilization. Medication management and safety. Observe and treat for any substance use withdrawals.   I certify that inpatient services furnished can reasonably be expected to improve the patient's condition.  Thresa RossAKHTAR, Zyann Mabry, MD 03/11/2016, 11:23 AM
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
## 2016-03-12 ENCOUNTER — Ambulatory Visit (HOSPITAL_COMMUNITY)
Admission: AD | Admit: 2016-03-12 | Discharge: 2016-03-12 | Disposition: A | Discharge status: Home / Self Care | Payer: No Typology Code available for payment source | Attending: Psychiatry | Admitting: Psychiatry

## 2016-03-12 DIAGNOSIS — F1020 Alcohol dependence, uncomplicated: Secondary | ICD-10-CM

## 2016-03-12 LAB — CBC WITH DIFFERENTIAL/PLATELET
BASOS ABS: 0.1 10*3/uL (ref 0.0–0.1)
BASOS PCT: 1 %
Eosinophils Absolute: 0.3 10*3/uL (ref 0.0–0.7)
Eosinophils Relative: 6 %
HEMATOCRIT: 31.9 % — AB (ref 39.0–52.0)
HEMOGLOBIN: 10.7 g/dL — AB (ref 13.0–17.0)
LYMPHS PCT: 29 %
Lymphs Abs: 1.7 10*3/uL (ref 0.7–4.0)
MCH: 33.4 pg (ref 26.0–34.0)
MCHC: 33.5 g/dL (ref 30.0–36.0)
MCV: 99.7 fL (ref 78.0–100.0)
MONO ABS: 0.5 10*3/uL (ref 0.1–1.0)
MONOS PCT: 9 %
NEUTROS ABS: 3.3 10*3/uL (ref 1.7–7.7)
NEUTROS PCT: 55 %
Platelets: 218 10*3/uL (ref 150–400)
RBC: 3.2 MIL/uL — ABNORMAL LOW (ref 4.22–5.81)
RDW: 13.1 % (ref 11.5–15.5)
WBC: 5.9 10*3/uL (ref 4.0–10.5)

## 2016-03-12 LAB — COMPREHENSIVE METABOLIC PANEL WITH GFR
ALT: 30 U/L (ref 17–63)
AST: 48 U/L — ABNORMAL HIGH (ref 15–41)
Albumin: 3.8 g/dL (ref 3.5–5.0)
Alkaline Phosphatase: 75 U/L (ref 38–126)
Anion gap: 8 (ref 5–15)
BUN: 16 mg/dL (ref 6–20)
CO2: 28 mmol/L (ref 22–32)
Calcium: 9.6 mg/dL (ref 8.9–10.3)
Chloride: 99 mmol/L — ABNORMAL LOW (ref 101–111)
Creatinine, Ser: 0.8 mg/dL (ref 0.61–1.24)
GFR calc Af Amer: >60 mL/min
GFR calc non Af Amer: >60 mL/min
Glucose, Bld: 99 mg/dL (ref 65–99)
Potassium: 4.4 mmol/L (ref 3.5–5.1)
Sodium: 135 mmol/L (ref 135–145)
Total Bilirubin: 0.9 mg/dL (ref 0.3–1.2)
Total Protein: 7 g/dL (ref 6.5–8.1)

## 2016-03-12 LAB — AMYLASE: Amylase: 64 U/L (ref 28–100)

## 2016-03-12 LAB — LIPASE, BLOOD: Lipase: 21 U/L (ref 11–51)

## 2016-03-12 MED ORDER — THIAMINE HCL 100 MG/ML IJ SOLN: 100.0000 mg | Freq: Once | INTRAMUSCULAR | Status: DC
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
## 2016-03-12 NOTE — Plan of Care (Signed)
Problem: Activity: Goal: Interest or engagement in activities will improve Outcome: Progressing Pt did attend evening AA group   

## 2016-03-12 NOTE — [facility] Group Notes (Signed)
Pt attended spiritual care group on grief and loss facilitated by chaplain Burnis KingfisherMatthew Ricarda Atayde   Group opened with brief discussion and psycho-social ed around grief and loss in relationships and in relation to self - identifying life patterns, circumstances, changes that cause losses. Established group norm of speaking from own life experience. Group goal of establishing open and affirming space for members to share loss and experience with grief, normalize grief experience and provide psycho social education and grief support.   Chanetta MarshallJimmy was reluctant to attend when invited by facilitator, but attended and was present throughout group.  He engaged with other group members, occasionally engaging in side conversation with neighbor, but was overall attentive.  Chanetta MarshallJimmy spoke about feeling stuck in small town, attempting to find services.  He is not able to have adequate transportation to work and other resources that he understand would help with his recovery, as there is no public transportation in his town.    Chanetta MarshallJimmy spoke of longing to see his daughter. 35 y/o [place] maternal aunt and uncle.  Rosanne AshingJim states that mother is using meth and his daughter was taken in by her family.  Rosanne AshingJim is motivated by wanting to maintain relationship with daughter and states he gets to talk with her on facetime.    He engaged with other group members offering affirmation for two group members who expressed feelings of "worthlessness".  He reminded his neighbor that it was not helpful to make decisions out of a place of worthlessness.   Belva CromeStalnaker, Mattisyn Cardona Wayne MDiv

## 2016-03-12 NOTE — Tx Team (Signed)
Interdisciplinary Treatment and Diagnostic Plan Update  03/12/2016 Time of Session: 9:30AM Shane DoffingJimmy Morrow MRN: [id number]  Principal Diagnosis: MDD   Secondary Diagnoses: Active Problems:   MDD (major depressive disorder), single episode   Current Medications:  Current Facility-Administered Medications  Medication Dose Route Frequency Provider Last Rate Last Dose  . acetaminophen (TYLENOL) tablet 650 mg  650 mg Oral Q6H PRN Thermon LeylandLaura A Davis, NP   650 mg at 03/11/16 1153  . alum & mag hydroxide-simeth (MAALOX/MYLANTA) 200-200-20 MG/5ML suspension 30 mL  30 mL Oral Q4H PRN Thermon LeylandLaura A Davis, NP      . chlordiazePOXIDE (LIBRIUM) capsule 25 mg  25 mg Oral Q6H PRN Thermon LeylandLaura A Davis, NP   25 mg at 03/11/16 2153  . chlordiazePOXIDE (LIBRIUM) capsule 25 mg  25 mg Oral TID Thermon LeylandLaura A Davis, NP   25 mg at 03/12/16 [id number]   Followed by  . [START ON 03/13/2016] chlordiazePOXIDE (LIBRIUM) capsule 25 mg  25 mg Oral BH-qamhs Thermon LeylandLaura A Davis, NP       Followed by  . [START ON 03/14/2016] chlordiazePOXIDE (LIBRIUM) capsule 25 mg  25 mg Oral Daily Thermon LeylandLaura A Davis, NP      . HYDROcodone-acetaminophen (NORCO/VICODIN) 5-325 MG per tablet 1 tablet  1 tablet Oral Q6H PRN Thermon LeylandLaura A Davis, NP   1 tablet at 03/12/16 0814  . hydrOXYzine (ATARAX/VISTARIL) tablet 25 mg  25 mg Oral Q6H PRN Thermon LeylandLaura A Davis, NP   25 mg at 03/11/16 2153  . ibuprofen (ADVIL,MOTRIN) tablet 600 mg  600 mg Oral Q6H PRN Thermon LeylandLaura A Davis, NP   600 mg at 03/11/16 2152  . Influenza vac split quadrivalent PF (FLUARIX) injection 0.5 mL  0.5 mL Intramuscular Tomorrow-1000 Thresa RossNadeem Akhtar, MD      . loperamide (IMODIUM) capsule 2-4 mg  2-4 mg Oral PRN Thermon LeylandLaura A Davis, NP      . magnesium hydroxide (MILK OF MAGNESIA) suspension 30 mL  30 mL Oral Daily PRN Thermon LeylandLaura A Davis, NP      . multivitamin with minerals tablet 1 tablet  1 tablet Oral Daily Thermon LeylandLaura A Davis, NP   1 tablet at 03/12/16 [phone]  . nicotine (NICODERM CQ - dosed in mg/24 hours) patch 21 mg  21 mg Transdermal Daily Thresa RossNadeem  Akhtar, MD   21 mg at 03/11/16 0834  . ondansetron (ZOFRAN-ODT) disintegrating tablet 4 mg  4 mg Oral Q6H PRN Thermon LeylandLaura A Davis, NP      . thiamine (B-1) injection 100 mg  100 mg Intramuscular Once Acquanetta SitElizabeth Woods Oates, MD      . thiamine (VITAMIN B-1) tablet 100 mg  100 mg Oral Daily Thermon LeylandLaura A Davis, NP   100 mg at 03/12/16 [id number]  . traZODone (DESYREL) tablet 50 mg  50 mg Oral QHS PRN Thermon LeylandLaura A Davis, NP   50 mg at 03/11/16 2153   PTA Medications: Prescriptions Prior to Admission  Medication Sig Dispense Refill Last Dose  . buprenorphine-naloxone (SUBOXONE) 2-0.5 MG SUBL SL tablet Place 2 tablets under the tongue daily. Patient states he takes 4mg  suboxone     . cyclobenzaprine (FLEXERIL) 5 MG tablet Take 1 tablet (5 mg total) by mouth 3 (three) times daily as needed. 30 tablet 0   . HYDROcodone-acetaminophen (NORCO/VICODIN) 5-325 MG per tablet Take 1 tablet by mouth every 4 (four) hours as needed for pain. 20 tablet 0   . omeprazole (PRILOSEC) 20 MG capsule Take 1 capsule (20 mg total) by mouth daily. (Patient not taking: Reported on 03/11/2016)  30 capsule 0 Not Taking at Unknown time  . PERCOCET 5-325 MG tablet Take 1 tablet by mouth every 6 (six) hours as needed for severe pain. 30 tablet 0     Patient Stressors: Marital or family conflict Substance abuse  Patient Strengths: Average or above average intelligence Communication skills General fund of knowledge  Treatment Modalities: Medication Management, Group therapy, Case management,  1 to 1 session with clinician, Psychoeducation, Recreational therapy.   Physician Treatment Plan for Primary Diagnosis: <principal problem not specified> Long Term Goal(s): Improvement in symptoms so as ready for discharge mood stabilization. increase insight about drug use   Short Term Goals: Ability to verbalize feelings will improve Ability to demonstrate self-control will improve Ability to identify and develop effective coping behaviors will  improve Ability to identify changes in lifestyle to reduce recurrence of condition will improve Ability to disclose and discuss suicidal ideas Ability to demonstrate self-control will improve Ability to maintain clinical measurements within normal limits will improve  Medication Management: Evaluate patient's response, side effects, and tolerance of medication regimen.  Therapeutic Interventions: 1 to 1 sessions, Unit Group sessions and Medication administration.  Evaluation of Outcomes: Progressing  Physician Treatment Plan for Secondary Diagnosis: Active Problems:   MDD (major depressive disorder), single episode  Long Term Goal(s): Improvement in symptoms so as ready for discharge mood stabilization. increase insight about drug use   Short Term Goals: Ability to verbalize feelings will improve Ability to demonstrate self-control will improve Ability to identify and develop effective coping behaviors will improve Ability to identify changes in lifestyle to reduce recurrence of condition will improve Ability to disclose and discuss suicidal ideas Ability to demonstrate self-control will improve Ability to maintain clinical measurements within normal limits will improve     Medication Management: Evaluate patient's response, side effects, and tolerance of medication regimen.  Therapeutic Interventions: 1 to 1 sessions, Unit Group sessions and Medication administration.  Evaluation of Outcomes: Progressing   RN Treatment Plan for Primary Diagnosis: MDD Long Term Goal(s): Knowledge of disease and therapeutic regimen to maintain health will improve  Short Term Goals: Ability to remain free from injury will improve, Ability to disclose and discuss suicidal ideas and Ability to identify and develop effective coping behaviors will improve  Medication Management: RN will administer medications as ordered by provider, will assess and evaluate patient's response and provide education to  patient for prescribed medication. RN will report any adverse and/or side effects to prescribing provider.  Therapeutic Interventions: 1 on 1 counseling sessions, Psychoeducation, Medication administration, Evaluate responses to treatment, Monitor vital signs and CBGs as ordered, Perform/monitor CIWA, COWS, AIMS and Fall Risk screenings as ordered, Perform wound care treatments as ordered.  Evaluation of Outcomes: Progressing   LCSW Treatment Plan for Primary Diagnosis: MDD Long Term Goal(s): Safe transition to appropriate next level of care at discharge, Engage patient in therapeutic group addressing interpersonal concerns.  Short Term Goals: Engage patient in aftercare planning with referrals and resources, Increase social support, Facilitate patient progression through stages of change regarding substance use diagnoses and concerns and Identify triggers associated with mental health/substance abuse issues  Therapeutic Interventions: Assess for all discharge needs, 1 to 1 time with Social worker, Explore available resources and support systems, Assess for adequacy in community support network, Educate family and significant other(s) on suicide prevention, Complete Psychosocial Assessment, Interpersonal group therapy.  Evaluation of Outcomes: Progressing   Progress in Treatment: Attending groups: No. New to unit. Continuing to assess.  Participating in groups:  No. Taking medication as prescribed: Yes. Toleration medication: Yes. Family/Significant other contact made: No, will contact:  family member if patient consents Patient understands diagnosis: Yes. Discussing patient identified problems/goals with staff: Yes. Medical problems stabilized or resolved: Yes. Denies suicidal/homicidal ideation: Yes. Issues/concerns per patient self-inventory: No. Other: n/a   New problem(s) identified: No, Describe:  pt not willing to meet with CSW at this time to complete assessment and discuss  aftercare. pt also appears to be a poor historian at this time.  Discharge Plan or Barriers: CSW assessing for appropriate referrals.   Reason for Continuation of Hospitalization: Depression Medication stabilization Suicidal ideation Withdrawal symptoms  Estimated Length of Stay: 2-4 days   Attendees: Patient: 03/12/2016 11:40 AM  Physician: Dr. Mckinley Jewel MD 03/12/2016 11:40 AM  Nursing: Cathlean Cower RN 03/12/2016 11:40 AM  RN Care Manager: 03/12/2016 11:40 AM  Social Worker: Trula Slade, LCSW 03/12/2016 11:40 AM  Recreational Therapist:  03/12/2016 11:40 AM  Other:  03/12/2016 11:40 AM  Other:  03/12/2016 11:40 AM  Other: 03/12/2016 11:40 AM    Scribe for Treatment Team: Ledell Peoples Smart, LCSW 03/12/2016 11:40 AM

## 2016-03-12 NOTE — [facility] Group Notes (Signed)
[facility] LCSW Group Therapy  03/12/2016 2:16 PM  Type of Therapy:  Group Therapy  Participation Level:  Did Not Attend-pt invited. Chose to rest in room.   Summary of Progress/Problems: Today's Topic: Overcoming Obstacles. Patients identified one short term goal and potential obstacles in reaching this goal. Patients processed barriers involved in overcoming these obstacles. Patients identified steps necessary for overcoming these obstacles and explored motivation (internal and external) for facing these difficulties head on.   Shaley Leavens N Smart LCSW 03/12/2016, 2:16 PM
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
## 2016-03-12 NOTE — Progress Notes (Signed)
Recreation Therapy Notes  Date: 03/12/16 Time: 0930 Location: 300 Hall Dayroom  Group Topic: Stress Management  Goal Area(s) Addresses:  Patient will verbalize importance of using healthy stress management.  Patient will identify positive emotions associated with healthy stress management.   Intervention: Stress Management  Activity :  Floating on a Cloud.  LRT introduced the stress management technique of guided imagery.  LRT read a script which allowed patients to participate in the activity.  Patients were to follow along as LRT read script.  Education:  Stress Management, Discharge Planning.   Education Outcome: Acknowledges edcuation/In group clarification offered/Needs additional education  Clinical Observations/Feedback: Pt did not attend group.    Caroll RancherMarjette Dason Mosley, LRT/CTRS         Caroll RancherLindsay, Arch Methot A 03/12/2016 12:04 PM

## 2016-03-12 NOTE — Progress Notes (Signed)
The patient attended the majority of the A.A.meeting before walking out.

## 2016-03-12 NOTE — Progress Notes (Signed)
Patient ID: Shane DoffingJimmy Morrow, male   DOB: 02/10/1981, 35 y.o.   MRN: [id number]   Pt currently presents with a flat affect and sullen behavior. Pt reports hopelessness and depression. Pt states "I thought I would be dead tonight, I don't know why I am still alive." Pt reports good sleep with current medication regimen. Pt reports chest pain, head pain and sensitivity to light. Reports no current breathing problems. Pt isolative.   Pt provided with medications per providers orders. Pt's labs, results and vitals were monitored throughout the night. Pt supported emotionally and encouraged to express concerns and questions. Pt educated on medications.  Pt's safety ensured with 15 minute and environmental checks. Pt reports active SI, plan to "step in front of a tractor trailer." Pt currently denies HI and A/V hallucinations. Pt verbally agrees to seek staff if HI or A/VH occurs and to consult with staff before acting on any harmful thoughts. Will continue POC.
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
## 2016-03-13 ENCOUNTER — Encounter (HOSPITAL_COMMUNITY): Payer: Self-pay

## 2016-03-13 MED ORDER — IBUPROFEN 200 MG PO TABS
200.0000 mg | ORAL_TABLET | Freq: Once | ORAL | Status: AC
  Administered 2016-03-13: 200 mg via ORAL
  Filled 2016-03-13 (×2): qty 1

## 2016-03-13 MED ORDER — BUPROPION HCL ER (XL) 150 MG PO TB24
150.0000 mg | ORAL_TABLET | Freq: Every day | ORAL | Status: DC
  Administered 2016-03-13 – 2016-03-15 (×3): 150 mg via ORAL
  Filled 2016-03-13 (×5): qty 1

## 2016-03-13 MED ORDER — IBUPROFEN 800 MG PO TABS
800.0000 mg | ORAL_TABLET | Freq: Four times a day (QID) | ORAL | Status: DC | PRN
  Administered 2016-03-13 – 2016-03-19 (×13): 800 mg via ORAL
  Filled 2016-03-13 (×13): qty 1

## 2016-03-13 NOTE — Progress Notes (Signed)
Patient ID: Shane DoffingJimmy Chiusano, male   DOB: 01/12/1981, 35 y.o.   MRN: [id number] D: Client in bed most of the shift, up for medications. Client "doing alright" A: Writer assessed client for symptoms of withdrawal, medications reviewed, administered as ordered. Staff will monitor q2915min for safety. R:  Client is safe on the unit, did not attend group.

## 2016-03-13 NOTE — [facility] Group Notes (Signed)
Patient did not attend group.
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
## 2016-03-13 NOTE — Progress Notes (Signed)
D: Chanetta MarshallJimmy has been minimally interactive today. This a.m., he became irate when told he could not be given Vicodin, the order for which had expired. Pt expressed a desire to throw his meds before apologizing. He is alternately cranky and apologetic with this Clinical research associatewriter, speaking with his head down. While irritable, he expressed SI and contracted for safety. On his self inventory form, he denied SI. On the form he also reported fair sleep, low energy level, poor concentration. He rated his depression 9, hopelessness 10, anxiety 9 out of 10. He appeared somewhat invested in morning goals groups, expressing some interest in the day's packet on recovery.  A: Meds given as ordered. Q15 safety checks maintained. Support/encouragement offered.  R: Pt remains free from harm and continues with treatment. Will continue to monitor for needs/safety.

## 2016-03-13 NOTE — [facility] Group Notes (Signed)
[facility] Group Notes:  (Nursing/MHT/Case Management/Adjunct)  Date:  03/13/2016  Time:  0915  Type of Therapy:  Nurse Education  Participation Level:  Active  Participation Quality:  Appropriate  Affect:  Depressed and Flat  Cognitive:  Appropriate  Insight:  Appropriate  Engagement in Group:  Engaged  Modes of Intervention:  Discussion, Education and Support  Summary of Progress/Problems: Pt spoke in group and shared that he intended to work on the packet.  Maurine SimmeringShugart, Odyn Turko M 03/13/2016, 10:03 AM

## 2016-03-13 NOTE — Progress Notes (Signed)
Recreation Therapy Notes   Animal-Assisted Activity (AAA) Program Checklist/Progress Notes Patient Eligibility Criteria Checklist & Daily Group note for Rec TxIntervention  Date: 10.24.2017 Time: 2:45pm Location: 4 00 Hall Dayroom   AAA/T Program Assumption of Risk Form signed by Patient/ or Parent Legal Guardian No. Patient refused AAA services during admission.    Marykay Lexenise L Illana Nolting, LRT/CTRS  Rhondalyn Clingan L 03/13/2016 3:01 PM

## 2016-03-13 NOTE — [facility] Group Notes (Signed)
[facility] LCSW Group Therapy  03/13/2016 1:19 PM  Type of Therapy:  Group Therapy  Participation Level:  Active  Participation Quality:  Attentive  Affect:  Appropriate  Cognitive:  Alert and Oriented  Insight:  Improving  Engagement in Therapy:  Improving  Modes of Intervention:  Discussion, Education, Exploration, Problem-solving, Rapport Building, Socialization and Support  Summary of Progress/Problems: [facility] Speaker came to talk about his personal journey with substance abuse and addiction. The pt processed ways by which to relate to the speaker. MHA speaker provided handouts and educational information pertaining to groups and services offered by [facility].   Cookie Pore N Smart LCSW 03/13/2016, 1:19 PM
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
## 2016-03-14 NOTE — Progress Notes (Signed)
D: Chanetta MarshallJimmy has been out of bed more today, playing cards and going off the unit for recreation. He denied SI this a.m "unless I'm outdoors" but admitted some on his self inventory form. He contracts for safety while on the unit. His pain scores have been lower, and he says his pain is improving. On his self inventory form, he's reported poor sleep, fair appetite, low energy, and poor concentration. He's rated his depression 8/10, hopelessness 10/10, and anxiety 8-10/10. He has been made an effort to be pleasant.  A: Meds given as ordered. Q15 safety checks maintained. Support/encouragement offered.  R: Pt remains free from harm and continues with treatment. Will continue to monitor for needs/safety.

## 2016-03-14 NOTE — Progress Notes (Signed)
Recreation Therapy Notes  Date: 03/14/16 Time: 0930 Location: 300 Hall Dayroom  Group Topic: Stress Management  Goal Area(s) Addresses:  Patient will verbalize importance of using healthy stress management.  Patient will identify positive emotions associated with healthy stress management.   Behavioral Response: Engaged  Intervention: Stress Management  Activity :  Diplomatic Services operational officereaceful Place.  LRT introduced the stress management technique of guided imagery.  LRT read script to engage patients in the technique.  Patients were to follow along as LRT read script.     Education:  Stress Management, Discharge Planning.   Education Outcome: Acknowledges edcuation/In group clarification offered/Needs additional education  Clinical Observations/Feedback:  Pt attended group.   Caroll RancherMarjette Irene Mitcham, LRT/CTRS    Lillia AbedLindsay, Yigit Norkus A 03/14/2016 12:00 PM

## 2016-03-14 NOTE — Progress Notes (Signed)
[facility] MD Progress Note  03/14/2016 12:19 PM  Patient Active Problem List   Diagnosis Date Noted  . MDD (major depressive disorder), single episode 03/10/2016    Diagnosis: Substance use disorder, depression  Subjective: Patient states he feels a little better today although he still feels he is experiencing some withdrawal effects. He denies any side effects from the Wellbutrin. He admits at times he still has some passive SI.  Objective: Well-developed thin man who appears in no apparent distress speech and motor appear within normal limits mood is described as "alright" affect is somewhat dysphoric and anxious thought processes are linear and goal-directed thought content endorses sometimes having passive suicidal ideation but denies any homicidal ideation or psychotic symptoms. Alert and oriented to person and knows that it is Wednesday and October 2017 but cannot name date. Has difficulty describing where he is beyond [place]. Insight and judgment are somewhat lacking, IQ appears to be in the low average to average range.         Current Facility-Administered Medications (Analgesics):  .  acetaminophen (TYLENOL) tablet 650 mg .  ibuprofen (ADVIL,MOTRIN) tablet 800 mg     Current Facility-Administered Medications (Other):  .  alum & mag hydroxide-simeth (MAALOX/MYLANTA) 200-200-20 MG/5ML suspension 30 mL .  buPROPion (WELLBUTRIN XL) 24 hr tablet 150 mg .  Influenza vac split quadrivalent PF (FLUARIX) injection 0.5 mL .  magnesium hydroxide (MILK OF MAGNESIA) suspension 30 mL .  multivitamin with minerals tablet 1 tablet .  nicotine (NICODERM CQ - dosed in mg/24 hours) patch 21 mg .  thiamine (B-1) injection 100 mg .  thiamine (VITAMIN B-1) tablet 100 mg .  traZODone (DESYREL) tablet 50 mg  No current outpatient prescriptions on file.  Vital Signs:Blood pressure 118/88, pulse 73, temperature 98 F (36.7 C), temperature source Oral, resp. rate (!) 75, height 6' 1" (1.854  m), weight 54.4 kg (120 lb), SpO2 100 %.    Lab Results:  Results for orders placed or performed during the hospital encounter of 03/10/16 (from the past 48 hour(s))  Comprehensive metabolic panel     Status: Abnormal   Collection Time: 03/12/16  6:46 PM  Result Value Ref Range   Sodium 135 135 - 145 mmol/L   Potassium 4.4 3.5 - 5.1 mmol/L   Chloride 99 (L) 101 - 111 mmol/L   CO2 28 22 - 32 mmol/L   Glucose, Bld 99 65 - 99 mg/dL   BUN 16 6 - 20 mg/dL   Creatinine, Ser 0.80 0.61 - 1.24 mg/dL   Calcium 9.6 8.9 - 10.3 mg/dL   Total Protein 7.0 6.5 - 8.1 g/dL   Albumin 3.8 3.5 - 5.0 g/dL   AST 48 (H) 15 - 41 U/L   ALT 30 17 - 63 U/L   Alkaline Phosphatase 75 38 - 126 U/L   Total Bilirubin 0.9 0.3 - 1.2 mg/dL   GFR calc non Af Amer >60 >60 mL/min   GFR calc Af Amer >60 >60 mL/min    Comment: (NOTE) The eGFR has been calculated using the CKD EPI equation. This calculation has not been validated in all clinical situations. eGFR's persistently <60 mL/min signify possible Chronic Kidney Disease.    Anion gap 8 5 - 15    Comment: Performed at [facility]  Lipase, blood     Status: None   Collection Time: 03/12/16  6:46 PM  Result Value Ref Range   Lipase 21 11 - 51 U/L    Comment:   Performed at [facility]  Amylase     Status: None   Collection Time: 03/12/16  6:46 PM  Result Value Ref Range   Amylase 64 28 - 100 U/L    Comment: Performed at [facility]  CBC with Differential/Platelet     Status: Abnormal   Collection Time: 03/12/16  6:46 PM  Result Value Ref Range   WBC 5.9 4.0 - 10.5 K/uL   RBC 3.20 (L) 4.22 - 5.81 MIL/uL   Hemoglobin 10.7 (L) 13.0 - 17.0 g/dL   HCT 31.9 (L) 39.0 - 52.0 %   MCV 99.7 78.0 - 100.0 fL   MCH 33.4 26.0 - 34.0 pg   MCHC 33.5 30.0 - 36.0 g/dL   RDW 13.1 11.5 - 15.5 %   Platelets 218 150 - 400 K/uL   Neutrophils Relative % 55 %   Neutro Abs 3.3 1.7 - 7.7 K/uL   Lymphocytes Relative 29 %    Lymphs Abs 1.7 0.7 - 4.0 K/uL   Monocytes Relative 9 %   Monocytes Absolute 0.5 0.1 - 1.0 K/uL   Eosinophils Relative 6 %   Eosinophils Absolute 0.3 0.0 - 0.7 K/uL   Basophils Relative 1 %   Basophils Absolute 0.1 0.0 - 0.1 K/uL    Comment: Performed at [facility]    Physical Findings: AIMS: Facial and Oral Movements Muscles of Facial Expression: None, normal Lips and Perioral Area: None, normal Jaw: None, normal Tongue: None, normal,Extremity Movements Upper (arms, wrists, hands, fingers): None, normal Lower (legs, knees, ankles, toes): None, normal, Trunk Movements Neck, shoulders, hips: None, normal, Overall Severity Severity of abnormal movements (highest score from questions above): None, normal Incapacitation due to abnormal movements: None, normal Patient's awareness of abnormal movements (rate only patient's report): No Awareness, Dental Status Current problems with teeth and/or dentures?: No Does patient usually wear dentures?: No  CIWA:  CIWA-Ar Total: 0 COWS:      Assessment/Plan: Patient tolerating the addition of Wellbutrin well. Feels slightly better today and does not endorse any active plan to harm himself or others immediately. He is detoxing without incident. He appears to be fairly well controlled for pain since changing to the combined Tylenol and Motrin regimen. We will continue to monitor him for safety. Social work will work with patient about support and follow-up after discharge.  Linard Millers, MD 03/14/2016, 12:19 PM

## 2016-03-15 MED ORDER — BUPROPION HCL ER (XL) 300 MG PO TB24
300.0000 mg | ORAL_TABLET | Freq: Every day | ORAL | Status: DC
  Administered 2016-03-16 – 2016-03-20 (×5): 300 mg via ORAL
  Filled 2016-03-15: qty 1
  Filled 2016-03-15: qty 21
  Filled 2016-03-15 (×5): qty 1

## 2016-03-15 MED ORDER — HYDROXYZINE HCL 50 MG PO TABS
50.0000 mg | ORAL_TABLET | Freq: Four times a day (QID) | ORAL | Status: DC | PRN
  Administered 2016-03-15 – 2016-03-20 (×6): 50 mg via ORAL
  Filled 2016-03-15 (×5): qty 1
  Filled 2016-03-15: qty 20
  Filled 2016-03-15 (×2): qty 1

## 2016-03-15 NOTE — [facility] Group Notes (Signed)
[facility] Group Notes:  (Nursing/MHT/Case Management/Adjunct)  Date:  03/15/2016  Time:  1:53 PM  Type of Therapy:  Nurse Education  Participation Level:  Active  Participation Quality:  Appropriate  Affect:  Appropriate  Cognitive:  Appropriate  Insight:  Appropriate  Engagement in Group:  Engaged  Modes of Intervention:  Discussion and Problem-solving  Summary of Progress/Problems: Patient attended nurse education group. He was able to report his goal for today as, "Stay calm". He was appropriate.   Almira Barenny G Swayzie Choate 03/15/2016, 1:53 PM

## 2016-03-15 NOTE — Progress Notes (Signed)
D: Pt A & O X 4. Visible in hall way on initial contact, replied "I'm alright now, I slept ok last night".  Denies SI, HI, AVH and pain. Reports fair sleep last night with fair appetite. Rates his depression 9/10, hopelessness 10/10 and anxiety 9/10.  A: Scheduled and PRN medications administered as prescribed. Emotional support and availability provided to pt. Writer encouraged pt to voice concerns and comply with treatment regimen. Q 15 minutes safety checks maintained without incident.  R: Pt receptive to care. Compliant with medications, denies adverse drug reactions. Observed in dayroom for majority of this shift; interacted well with peers and staff. Attended AM group on unit but did not attend social worker group. POC maintained for safety and mood stability.

## 2016-03-15 NOTE — Progress Notes (Signed)
Pt has been observed mostly in the dayroom watching TV, with minimal interaction with peers.  He reports he has thoughts of killing himself, but contracts for safety with Clinical research associatewriter.  He denies HI/AVH.  Conversation was minimal with patient.  His main focus of conversation with writer was why the doctor would not prescribe a stronger pain medication to someone who "obviously needs it".  After talking a minute, he admitted that the Ibuprofen/tylenol was helping, and his pain level at that time was 6/10.  He was given the combination along with Trazodone 50 mg at bedtime.  He was offered a cold pack, but declined it.  Support and encouragement offered.  Pt continues with poor eye contact.  Discharge plans are in process.  Pt is open to a long term treatment facility.  Safety maintained with q15 minute checks.

## 2016-03-15 NOTE — [facility] Group Notes (Signed)
[facility] LCSW Group Therapy  03/15/2016 4:07 PM  Type of Therapy:  Group Therapy  Participation Level:  Did Not Attend-pt invited.chose to remain in bed to rest.   Summary of Progress/Problems: Emotion Regulation: This group focused on both positive and negative emotion identification and allowed group members to process ways to identify feelings, regulate negative emotions, and find healthy ways to manage internal/external emotions. Group members were asked to reflect on a time when their reaction to an emotion led to a negative outcome and explored how alternative responses using emotion regulation would have benefited them. Group members were also asked to discuss a time when emotion regulation was utilized when a negative emotion was experienced.   Trip Cavanagh N Smart LCSW 03/15/2016, 4:07 PM

## 2016-03-15 NOTE — Progress Notes (Signed)
[facility]  03/15/2016 3:12 PM  Patient Active Problem List   Diagnosis Date Noted  . MDD (major depressive disorder), single episode 03/10/2016    Diagnosis: Opiate use disorder, depression  Subjective: "I don't feel so bad today." Patient reports however he still feels "scared of the future." And feels hopeless when he contemplates what is going to happen next. He reports that his functioning has decompensated since his mother passed away "she was my rock" and worsened with the recent breakup with his girlfriend as well. They still live together and he still has feelings for her but describes her as "too good for me." Patient denies active suicidal or homicidal ideation at present. Does feel like the Wellbutrin has been helpful. He does report some anxiety however.  Objective: Well-developed thin man in no apparent distress well groomed calm and appropriate pleasant describes mood as not so bad affect is congruent, thought processes linear and goal-directed thought content endorses hopelessness but no acute suicidal or homicidal ideation, plan or intent patient did not retain information about where we are located given him yesterday and reports that he has a very poor short-term memory although his long-term memory appears intact insight and judgment are somewhat limited IQ appears an average range         Current Facility-Administered Medications (Analgesics):  .  acetaminophen (TYLENOL) tablet 650 mg .  ibuprofen (ADVIL,MOTRIN) tablet 800 mg     Current Facility-Administered Medications (Other):  .  alum & mag hydroxide-simeth (MAALOX/MYLANTA) 200-200-20 MG/5ML suspension 30 mL .  [START ON 03/16/2016] buPROPion (WELLBUTRIN XL) 24 hr tablet 300 mg .  hydrOXYzine (ATARAX/VISTARIL) tablet 50 mg .  magnesium hydroxide (MILK OF MAGNESIA) suspension 30 mL .  multivitamin with minerals tablet 1 tablet .  nicotine (NICODERM CQ - dosed in mg/24 hours) patch 21 mg .   thiamine (VITAMIN B-1) tablet 100 mg .  traZODone (DESYREL) tablet 50 mg  No current outpatient prescriptions on file.  Vital Signs:Blood pressure 123/85, pulse (!) 49, temperature 97.5 F (36.4 C), temperature source Oral, resp. rate 16, height 6\' 1"  (1.854 m), weight 54.4 kg (120 lb), SpO2 100 %.    Lab Results: No results found for this or any previous visit (from the past 48 hour(s)).  Physical Findings: AIMS: Facial and Oral Movements Muscles of Facial Expression: None, normal Lips and Perioral Area: None, normal Jaw: None, normal Tongue: None, normal,Extremity Movements Upper (arms, wrists, hands, fingers): None, normal Lower (legs, knees, ankles, toes): None, normal, Trunk Movements Neck, shoulders, hips: None, normal, Overall Severity Severity of abnormal movements (highest score from questions above): None, normal Incapacitation due to abnormal movements: None, normal Patient's awareness of abnormal movements (rate only patient's report): No Awareness, Dental Status Current problems with teeth and/or dentures?: No Does patient usually wear dentures?: No  CIWA:  CIWA-Ar Total: 0 COWS:      Assessment/Plan: Patient has made some progress. We are working on identifying resources for further treatment for his substance use and depression. We'll increase the Wellbutrin to 300 mg a day for now and continue to monitor effect as patient says it has been helpful. If patient continues to have a significant anxiety component however may wish to consider another agent  Acquanetta SitElizabeth Woods Oates, MD 03/15/2016, 3:12 PM

## 2016-03-15 NOTE — Progress Notes (Signed)
[facility] referral faxed to [facility] for authorization number 03/15/2016 8:50 AM [id number].  Trula SladeHeather Smart, MSW, LCSW Clinical Social Worker 03/15/2016 8:51 AM

## 2016-03-16 NOTE — Progress Notes (Addendum)
[facility]  03/16/2016 9:18 AM  Patient Active Problem List   Diagnosis Date Noted  . MDD (major depressive disorder), single episode 03/10/2016    Diagnosis: Substance use disorders, depression  Subjective: Patient states his mood is fairly good today and he slept well last night. He reports that he initially did not believe that the ibuprofen and Tylenol would work well for pain control but yesterday he forgot to take them for about 10 or 12 hours and noted his pain was much worse and realized that this was helping. Patient does have some dizziness at standing up at times which he says has been going on for a while and he was encouraged to let us know if this becomes worse or is a problem for him. He is about to start an increased dose of Wellbutrin today. He denies any acute suicidal or homicidal ideation, plan or intent. He says he would like to attend the [facility] in [place] if possible.  Objective: Well-developed thin male in no apparent distress wearing hospital paper scrubs and well groomed, pleasant and appropriate, mood is described as all right affect is congruent thought processes linear and goal-directed thought content endorses Shane AasDoris is some anxiety about the future but does not endorse any acute suicidal or homicidal ideation, plan or intent, no psychotic symptoms, alert and oriented to city and situation although not geographically accurate and is oriented to month and year and person IQ appears within the average range insight and judgment are fair to limited         Current Facility-Administered Medications (Analgesics):  .  acetaminophen (TYLENOL) tablet 650 mg .  ibuprofen (ADVIL,MOTRIN) tablet 800 mg     Current Facility-Administered Medications (Other):  .  alum & mag hydroxide-simeth (MAALOX/MYLANTA) 200-200-20 MG/5ML suspension 30 mL .  buPROPion (WELLBUTRIN XL) 24 hr tablet 300 mg .  hydrOXYzine (ATARAX/VISTARIL) tablet 50 mg .  magnesium hydroxide  (MILK OF MAGNESIA) suspension 30 mL .  multivitamin with minerals tablet 1 tablet .  nicotine (NICODERM CQ - dosed in mg/24 hours) patch 21 mg .  thiamine (VITAMIN B-1) tablet 100 mg .  traZODone (DESYREL) tablet 50 mg  No current outpatient prescriptions on file.  Vital Signs:Blood pressure 96/66, pulse 72, temperature 98.1 F (36.7 C), temperature source Oral, resp. rate 16, height 6\' 1"  (1.854 m), weight 54.4 kg (120 lb), SpO2 100 %.    Lab Results: No results found for this or any previous visit (from the past 48 hour(s)).  Physical Findings: AIMS: Facial and Oral Movements Muscles of Facial Expression: None, normal Lips and Perioral Area: None, normal Jaw: None, normal Tongue: None, normal,Extremity Movements Upper (arms, wrists, hands, fingers): None, normal Lower (legs, knees, ankles, toes): None, normal, Trunk Movements Neck, shoulders, hips: None, normal, Overall Severity Severity of abnormal movements (highest score from questions above): None, normal Incapacitation due to abnormal movements: None, normal Patient's awareness of abnormal movements (rate only patient's report): No Awareness, Dental Status Current problems with teeth and/or dentures?: No Does patient usually wear dentures?: No  CIWA:  CIWA-Ar Total: 1 COWS:      Assessment/Plan: Patient appears to be progressing. Physically he appears improved and his mood also appears overall to be improved. We will continue with current medications and therapy and current discharge planning to have patient transition to more treatment for mental health and substance use. We will check B12 and TSH secondary to patient's complaints of weight loss and poor nutrition.  Verlee MonteElizabeth Woods PhillipsburgOates,  MD 03/16/2016, 9:18 AM

## 2016-03-16 NOTE — Progress Notes (Signed)
Pt reports he is doing a little better today.  He denies SI/HI/AVH.  He is motivated to go for long term treatment when arrangements are made.  He states his withdrawal symptoms have decreased.  He says he has been a little sad this evening as some of the patients he had become friends were discharge today.  He chose not to attend evening karaoke, but stayed in his room.  His chief complaint is the pain from his fx ribs. He is receiving pain meds as ordered.  Support and encouragement offered.  A referral to [facility] has been made.  Discharge plans are in process.  Safety maintained with q15 minute checks.

## 2016-03-16 NOTE — [facility] Suicide Risk Assessment (Signed)
[facility] INPATIENT:  Family/Significant Other Suicide Prevention Education  Suicide Prevention Education:  Patient Refusal for Family/Significant Other Suicide Prevention Education: The patient Shane Morrow has refused to provide written consent for family/significant other to be provided Family/Significant Other Suicide Prevention Education during admission and/or prior to discharge.  Physician notified.  SPE completed with pt, as pt refused to consent to family contact. SPI pamphlet provided to pt and pt was encouraged to share information with support network, ask questions, and talk about any concerns relating to SPE. Pt denies access to guns/firearms and verbalized understanding of information provided. Mobile Crisis information also provided to pt.   Daimien Patmon N Smart LCSW 03/16/2016, 11:01 AM

## 2016-03-16 NOTE — [facility] Group Notes (Signed)
[facility] LCSW Group Therapy  03/16/2016 2:04 PM  Type of Therapy:  Group Therapy  Participation Level:  Minimal  Participation Quality:  Drowsy  Affect:  Depressed and Flat  Cognitive:  Lacking  Insight:  Limited  Engagement in Therapy:  Limited  Modes of Intervention:  Discussion, Education, Exploration, Problem-solving, Rapport Building, Socialization and Support  Summary of Progress/Problems: Feelings around Relapse. Group members discussed the meaning of relapse and shared personal stories of relapse, how it affected them and others, and how they perceived themselves during this time. Group members were encouraged to identify triggers, warning signs and coping skills used when facing the possibility of relapse. Social supports were discussed and explored in detail. Chanetta MarshallJimmy was attentive but did not actively participate in group discussion unless prompted by CSW. He continues to present with depressed mood and flat affect. Chanetta MarshallJimmy does not appear to be making progress in the group setting at this time.     Bradey Luzier N Smart LCSW 03/16/2016, 2:04 PM

## 2016-03-16 NOTE — Progress Notes (Signed)
Recreation Therapy Notes  Date: 03/16/16 Time: 0930 Location: [address] Dayroom  Group Topic: Stress Management  Goal Area(s) Addresses:  Patient will verbalize importance of using healthy stress management.  Patient will identify positive emotions associated with healthy stress management.   Behavioral Response: Engaged  Intervention: Stress Management  Activity :  [facility].  LRT introduced the stress management technique of guided imagery.  LRT read a script to engage patients in the technique.  Patients were to follow along as LRT read the script.  Education:  Stress Management, Discharge Planning.   Education Outcome: Acknowledges edcuation/In group clarification offered/Needs additional education  Clinical Observations/Feedback: Pt attended group.    Shane Morrow, Shane Morrow     Lillia AbedLindsay, [name] 03/16/2016 11:19 AM

## 2016-03-16 NOTE — Tx Team (Signed)
Interdisciplinary Treatment and Diagnostic Plan Update  03/16/2016 Time of Session: 9:30AM Shane Morrow MRN: [id number]  Principal Diagnosis: MDD   Secondary Diagnoses: Active Problems:   MDD (major depressive disorder), single episode   Current Medications:  Current Facility-Administered Medications  Medication Dose Route Frequency Provider Last Rate Last Dose  . acetaminophen (TYLENOL) tablet 650 mg  650 mg Oral Q6H PRN Thermon Leyland, NP   650 mg at 03/16/16 0913  . alum & mag hydroxide-simeth (MAALOX/MYLANTA) 200-200-20 MG/5ML suspension 30 mL  30 mL Oral Q4H PRN Thermon Leyland, NP      . buPROPion (WELLBUTRIN XL) 24 hr tablet 300 mg  300 mg Oral Daily Acquanetta Sit, MD   300 mg at 03/16/16 0910  . hydrOXYzine (ATARAX/VISTARIL) tablet 50 mg  50 mg Oral Q6H PRN Acquanetta Sit, MD   50 mg at 03/16/16 0913  . ibuprofen (ADVIL,MOTRIN) tablet 800 mg  800 mg Oral Q6H PRN Acquanetta Sit, MD   800 mg at 03/15/16 2025  . magnesium hydroxide (MILK OF MAGNESIA) suspension 30 mL  30 mL Oral Daily PRN Thermon Leyland, NP      . multivitamin with minerals tablet 1 tablet  1 tablet Oral Daily Thermon Leyland, NP   1 tablet at 03/16/16 0910  . nicotine (NICODERM CQ - dosed in mg/24 hours) patch 21 mg  21 mg Transdermal Daily Thresa Ross, MD   21 mg at 03/16/16 0910  . thiamine (VITAMIN B-1) tablet 100 mg  100 mg Oral Daily Thermon Leyland, NP   100 mg at 03/16/16 0910  . traZODone (DESYREL) tablet 50 mg  50 mg Oral QHS PRN Thermon Leyland, NP   50 mg at 03/15/16 2310   PTA Medications: Prescriptions Prior to Admission  Medication Sig Dispense Refill Last Dose  . buprenorphine-naloxone (SUBOXONE) 2-0.5 MG SUBL SL tablet Place 2 tablets under the tongue daily. Patient states he takes 4mg  suboxone     . cyclobenzaprine (FLEXERIL) 5 MG tablet Take 1 tablet (5 mg total) by mouth 3 (three) times daily as needed. 30 tablet 0   . HYDROcodone-acetaminophen (NORCO/VICODIN) 5-325 MG per tablet  Take 1 tablet by mouth every 4 (four) hours as needed for pain. 20 tablet 0   . omeprazole (PRILOSEC) 20 MG capsule Take 1 capsule (20 mg total) by mouth daily. (Patient not taking: Reported on 03/11/2016) 30 capsule 0 Not Taking at Unknown time  . PERCOCET 5-325 MG tablet Take 1 tablet by mouth every 6 (six) hours as needed for severe pain. 30 tablet 0     Patient Stressors: Marital or family conflict Substance abuse  Patient Strengths: Average or above average intelligence Communication skills General fund of knowledge  Treatment Modalities: Medication Management, Group therapy, Case management,  1 to 1 session with clinician, Psychoeducation, Recreational therapy.   Physician Treatment Plan for Primary Diagnosis: <principal problem not specified> Long Term Goal(s): Improvement in symptoms so as ready for discharge mood stabilization. increase insight about drug use   Short Term Goals: Ability to verbalize feelings will improve Ability to demonstrate self-control will improve Ability to identify and develop effective coping behaviors will improve Ability to identify changes in lifestyle to reduce recurrence of condition will improve Ability to disclose and discuss suicidal ideas Ability to demonstrate self-control will improve Ability to maintain clinical measurements within normal limits will improve  Medication Management: Evaluate patient's response, side effects, and tolerance of medication regimen.  Therapeutic Interventions: 1 to  1 sessions, Unit Group sessions and Medication administration.  Evaluation of Outcomes: Progressing  Physician Treatment Plan for Secondary Diagnosis: Active Problems:   MDD (major depressive disorder), single episode  Long Term Goal(s): Improvement in symptoms so as ready for discharge mood stabilization. increase insight about drug use   Short Term Goals: Ability to verbalize feelings will improve Ability to demonstrate self-control will  improve Ability to identify and develop effective coping behaviors will improve Ability to identify changes in lifestyle to reduce recurrence of condition will improve Ability to disclose and discuss suicidal ideas Ability to demonstrate self-control will improve Ability to maintain clinical measurements within normal limits will improve     Medication Management: Evaluate patient's response, side effects, and tolerance of medication regimen.  Therapeutic Interventions: 1 to 1 sessions, Unit Group sessions and Medication administration.  Evaluation of Outcomes: Progressing   RN Treatment Plan for Primary Diagnosis: MDD Long Term Goal(s): Knowledge of disease and therapeutic regimen to maintain health will improve  Short Term Goals: Ability to remain free from injury will improve, Ability to disclose and discuss suicidal ideas and Ability to identify and develop effective coping behaviors will improve  Medication Management: RN will administer medications as ordered by provider, will assess and evaluate patient's response and provide education to patient for prescribed medication. RN will report any adverse and/or side effects to prescribing provider.  Therapeutic Interventions: 1 on 1 counseling sessions, Psychoeducation, Medication administration, Evaluate responses to treatment, Monitor vital signs and CBGs as ordered, Perform/monitor CIWA, COWS, AIMS and Fall Risk screenings as ordered, Perform wound care treatments as ordered.  Evaluation of Outcomes: Progressing   LCSW Treatment Plan for Primary Diagnosis: MDD Long Term Goal(s): Safe transition to appropriate next level of care at discharge, Engage patient in therapeutic group addressing interpersonal concerns.  Short Term Goals: Engage patient in aftercare planning with referrals and resources, Increase social support, Facilitate patient progression through stages of change regarding substance use diagnoses and concerns and  Identify triggers associated with mental health/substance abuse issues  Therapeutic Interventions: Assess for all discharge needs, 1 to 1 time with Social worker, Explore available resources and support systems, Assess for adequacy in community support network, Educate family and significant other(s) on suicide prevention, Complete Psychosocial Assessment, Interpersonal group therapy.  Evaluation of Outcomes: Progressing   Progress in Treatment: Attending groups: Yes Participating in groups: Yes, when he attends  Taking medication as prescribed: Yes. Toleration medication: Yes. Family/Significant other contact made: Pt does not know his sister's number and does not consent to additional family contact. SPE completed with pt.  Patient understands diagnosis: Yes. Discussing patient identified problems/goals with staff: Yes. Medical problems stabilized or resolved: Yes. Denies suicidal/homicidal ideation: Yes. during self report.  Issues/concerns per patient self-inventory: No. Other: n/a   New problem(s) identified:  Discharge Plan or Barriers: [facility] for Tuesday, 10/31 pending weekend/Monday notes/pt progress. [facility] but at this time, presents as too acute in terms of mental health for that program.   Reason for Continuation of Hospitalization: Depression Medication stabilization Withdrawal symptoms  Estimated Length of Stay: 3 days (possible Tuesday discharge to [facility] pending pt's progress over the weekend).   Attendees: Patient: 03/16/2016 10:51 AM  Physician: Dr. Mckinley Jewelates MD 03/16/2016 10:51 AM  Nursing: Sharalyn InkPenny, Patty RN 03/16/2016 10:51 AM  RN Care Manager: Onnie BoerJennifer Clark CM 03/16/2016 10:51 AM  Social Worker: Trula SladeHeather Smart, LCSW 03/16/2016 10:51 AM  Recreational Therapist:  03/16/2016 10:51 AM  Other:  03/16/2016 10:51 AM  Other:  03/16/2016 10:51 AM  Other: 03/16/2016 10:51 AM    Scribe for  Treatment Team: Ledell Peoples Smart, LCSW 03/16/2016 10:51 AM

## 2016-03-16 NOTE — Progress Notes (Signed)
Shane Morrow is OOB UAL on the [address] haq[address] today. His color is pale. He is unkempt. Sweaty. Says " I don't want to be a bother". A He takes his scheduled meds as planned and he completed his daily assessment and on it he wrote he deneid SI today and he rated his depression, hopelessness and anxiety " 7/8/6".  He says " I feel awful". He is dry and admits that he ' hasn't been drinking a lot'. R Safety in place. Cont to force fluids, medicate and treat symptoms and maintain safety.

## 2016-03-17 DIAGNOSIS — Z79899 Other long term (current) drug therapy: Secondary | ICD-10-CM

## 2016-03-17 DIAGNOSIS — F322 Major depressive disorder, single episode, severe without psychotic features: Principal | ICD-10-CM

## 2016-03-17 DIAGNOSIS — F17210 Nicotine dependence, cigarettes, uncomplicated: Secondary | ICD-10-CM

## 2016-03-17 LAB — TSH: TSH: 1.691 u[IU]/mL (ref 0.350–4.500)

## 2016-03-17 LAB — VITAMIN B12: Vitamin B-12: 477 pg/mL (ref 180–914)

## 2016-03-17 MED ORDER — LIDOCAINE 5 % EX PTCH
1.0000 | MEDICATED_PATCH | CUTANEOUS | Status: DC
  Administered 2016-03-17 – 2016-03-19 (×2): 1 via TRANSDERMAL
  Filled 2016-03-17 (×5): qty 1

## 2016-03-17 NOTE — Progress Notes (Signed)
Patient ID: Shane DoffingJimmy Morrow, male   DOB: 02/21/1981, 35 y.o.   MRN: [id number]   Pt currently presents with a sad, flat affect and depressed behavior. Pt guarded with staff and other patients. Pt reports to Clinical research associatewriter that their goal is to "get out of [place], maybe go to [place] where my sister is. She is sober and it is too easy for me to get medications here." Pt states "I am feeling a little better than when I came in because I know that I don't really want to hurt myself." Pt reports good sleep with current medication regimen.   Pt provided with medications per providers orders. Pt's labs and vitals were monitored throughout the night. Pt supported emotionally and encouraged to express concerns and questions. Pt educated on medications.  Pt's safety ensured with 15 minute and environmental checks. Pt reports some agitation with other patients on the hall. Pt currently denies SI/HI and A/V hallucinations. Pt verbally agrees to seek staff if SI/HI or A/VH occurs and to consult with staff before acting on any harmful thoughts. Will continue POC.
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
## 2016-03-17 NOTE — [facility] Group Notes (Signed)
[facility] Group Notes:  (Nursing/MHT/Case Management/Adjunct)  Date:  03/17/2016  Time:  5:36 PM  Type of Therapy:  Nurse Education  Participation Level:  Active  Participation Quality:  Inattentive  Affect:  Blunted and Defensive  Cognitive:  Oriented  Insight:  Lacking  Engagement in Group:  Lacking  Modes of Intervention:  Discussion and Problem-solving  Summary of Progress/Problems: patient refused to hold his head up through out group and slumped in his chair. Was able identify five positive things about himself.  Almira Barenny G Ethon Wymer 03/17/2016, 5:36 PM

## 2016-03-17 NOTE — Progress Notes (Signed)
D [place] is OOB UAL on the 300 hall this morning. HE is awake, alert and talkative at the med window this am at 0800 med pass. HE completes his daily assessment and on it he writes he denies having SI today and he rates his depression and anxiety " 7/9", resectively. He is less anxious, more talkative,  More interactive with staff evidenced by him making small talk with staff today. HE takes all of his scheduled meds as planned and he shares personall feelings in his morning group. R Safety in place.

## 2016-03-17 NOTE — [facility] Group Notes (Signed)
Identifying Needs   Date:  03/17/2016  Time:  0915  Type of Therapy:  Education:  Identifying Needs:  The group is focused on teaching patients how to identify their needs as a coping skills.  Participation Level:  Engaged  Participation Quality:  Good  Affect:  Flat  Cognitive:  Intact  Insight:  Minimal  Engagement in Group:  Engaged   Summary of Progress/Problems:  Shane Morrow, Shane Morrow Lynn 03/17/2016, 12:20 PM

## 2016-03-17 NOTE — Progress Notes (Signed)
Pt was observed at the beginning of the shift playing cards in the dayroom with other patients.  He seemed to be relaxed and enjoying himself.  He says his withdrawal symptoms are decreasing and the medications are helping.  He also says that the area on his chest and the rib pain is more tolerable.  He denies SI/HI/AVH.  He hopes to go for long term treatment after detox.  He polite and cooperative with staff.  He makes his needs known to staff.  Support and encouragement offered.  Discharge plans are in process.  He is hoping for a bed at [facility] next week.  Safety maintained with q15 minute checks.

## 2016-03-17 NOTE — [facility] Group Notes (Addendum)
   [facility] LCSW Group Therapy Note  03/17/2016  and  10:00 AM  Type of Therapy and Topic:  Group Therapy: Avoiding Self-Sabotaging and Enabling Behaviors  Participation Level:  Active  Participation Quality:  Intrusive and Inattentive  Affect:  Anxious, Irritable and Labile  Cognitive:  Oriented  Insight:  Limited  Engagement in Therapy:  Limited   Therapeutic models used: Cognitive Behavioral Therapy,  Person-Centered Therapy and Motivational Interviewing  Modes of Intervention:  Discussion, Exploration, Orientation, Rapport Building, Socialization and Support   Summary of Progress/Problems:  The main focus of today's process group was for the patient to identify ways in which they have in the past sabotaged their own recovery. Motivational Interviewing was utilized to identify motivation they may have for wanting to change. The Stages of Change were explained using a handout, and patients identified where they currently are with regard to stages of change. Patient reported he had nothing to be grateful for and feels "it will be best if I get out of [place]." Patient processed some of his anger and feelings that" medication don't help."    Carney Bernatherine C Harrill, LCSW
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
## 2016-03-18 NOTE — [facility] Group Notes (Signed)
[facility] LCSW Group Therapy  03/18/2016 10 - 10:55 AM  Type of Therapy:  Group Therapy  Participation Level:  Active  Participation Quality:  Intrusive and Monopolizing  Affect:  Irritable  Cognitive:  Oriented  Insight:  Limited  Engagement in Therapy:  Limited  Modes of Intervention:  Activity, Discussion, Exploration, Socialization and Support  Summary of Progress/Problems: Summary of Progress/Problems: The main focus of today's process group was to identify the patient's current support system and decide on other supports that can be put in place. An emphasis was placed on using counselor, doctor, therapy groups, 12-step groups, and problem-specific support groups to expand supports. There was also an extensive discussion about what constitutes a healthy support versus an unhealthy support. Pt engaged easily during group session. As patients processed their anxiety about discharge and described healthy supports patient reported he has no supports and is unwilling to risk getting new ones.  Patient chose a visual to represent decompensation as his brother's death and improvement as his children "which I never get to see."    [name]

## 2016-03-18 NOTE — Progress Notes (Signed)
D: Pt denies SI/HI/AVH. Pt is pleasant and cooperative. Pt very tearful this evening. Pt stated [place] to go to [place] with [place] sister due to everyone he knows in [place] drinks heavily. Pt appears to feel remorse for what he did and seems to want to get help.   A: Pt was offered support and encouragement. Pt was given scheduled medications. Pt was encourage to attend groups. Q 15 minute checks were done for safety.   R:Pt attends groups and interacts well with peers and staff. Pt is taking medication. Pt has no complaints.Pt receptive to treatment and safety maintained on unit.

## 2016-03-18 NOTE — Progress Notes (Signed)
Shane Morrow is seen standing at the med window this morning . He is quite talkative. He completes his daily assessment and on it he wrote he deneid SI today and he rated his depression, hopelessness and anxeity " 7/6/8", respectively. He says his lidoderm patches are helping his rib pain and he assists writer in placing today's patch exactly where he felt he would benefit more. R Safety is in place.
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
## 2016-03-19 NOTE — Progress Notes (Signed)
CSW left several messages for Olegario MessierKathy in admissions at [facility] regarding pt. No call back as of yet. [facility] and faxed updated progress notes on patient.  Trula SladeHeather Smart, MSW, LCSW Clinical Social Worker 03/19/2016 2:46 PM

## 2016-03-19 NOTE — [facility] Group Notes (Signed)
Pt attended spiritual care group on grief and loss facilitated by chaplain Burnis KingfisherMatthew Alyson Ki   Group opened with brief discussion and psycho-social ed around grief and loss in relationships and in relation to self - identifying life patterns, circumstances, changes that cause losses. Established group norm of speaking from own life experience. Group goal of establishing open and affirming space for members to share loss and experience with grief, normalize grief experience and provide psycho social education and grief support.     Shane Morrow was present through half of group time.  Left voluntarily during group and spoke with provider.  Shane Morrow early in group, Shane Morrow voluntarily expressed that spirituality had been a difficult subject for him.  He feels anger and distance from God and states "I do not talk to God anymore."  Stated this is connected to "losing my daughter" and other losses and traumas in life.  Shane Morrow expressed an understanding of providence in which god controls events and stated "I dont know why god would make those things happen."   Chaplain and group provided listening presence.  Members of group normalized Shane Morrow's feelings and provided support.    Shane Morrow, Shane Morrow

## 2016-03-19 NOTE — Progress Notes (Signed)
Recreation Therapy Notes  Date: 03/19/16 Time: 0930 Location: 300 Hall Dayroom  Group Topic: Stress Management  Goal Area(s) Addresses:  Patient will verbalize importance of using healthy stress management.  Patient will identify positive emotions associated with healthy stress management.   Behavioral Response: Engaged  Intervention: Calm App  Activity :  Body Scan Meditation.  LRT introduced the stress management technique of meditation.  LRT played a body scan meditation from the Calm app to allow the patients to engage in the activity.  Patients were to follow along as the meditation was being played.  Education:  Stress Management, Discharge Planning.   Education Outcome: Acknowledges edcuation/In group clarification offered/Needs additional education  Clinical Observations/Feedback: Pt attended group.   Caroll RancherMarjette Arlan Birks, LRT/CTRS     Caroll RancherLindsay, Lilliam Chamblee A 03/19/2016 12:02 PM

## 2016-03-19 NOTE — Progress Notes (Signed)
Patient was dayroom at time of assessment. Patient stated that he would like to go to treatment. He stated that he has been drug addict since the age of 35. He stated that he feels anxious "all the time." Patient rated his depression a 5/ Anxiety an 8/ Hopelessness 5. Patient denies any feelings of SI/HI/AVH.  Patient stated that he felt pain when sneezing. He stated that the pain was 10/10 when happening, but otherwise pain was tolerable. Patient declined pain medication in MAR. Patient had brief eye contact and appeared sad.   Patient remains safe with q 15 min checks, patient offered support, education, and encouragement.   Patient is receptive and compliant, will continue to monitor.

## 2016-03-19 NOTE — Progress Notes (Signed)
[facility]  03/19/2016 1:29 PM  Patient Active Problem List   Diagnosis Date Noted  . Severe single current episode of major depressive disorder, without psychotic features (HCC) 03/10/2016    Diagnosis: Alcohol use disorder, severe depression  Subjective: Patient reports that he is doing "all right." He admits that he still has ongoing anxiety about the future "I'm fine but I'm scared of the future." Denies acute suicidal or homicidal ideation, plan or intent. He is in favor of the treatment plan to send him to a treatment program hopefully tomorrow. No complaints of side effects from the increased Wellbutrin.  Objective: Well-developed well nourished male in no apparent distress pleasant and cooperative mood is described as "fine" affect is somewhat anxious thought processes linear and goal directed thought content denies current suicidal or homicidal ideation, plan or intent alert and oriented 3 insight and judgment are fair to limited IQ appears an average range         Current Facility-Administered Medications (Analgesics):  .  acetaminophen (TYLENOL) tablet 650 mg .  ibuprofen (ADVIL,MOTRIN) tablet 800 mg     Current Facility-Administered Medications (Other):  .  alum & mag hydroxide-simeth (MAALOX/MYLANTA) 200-200-20 MG/5ML suspension 30 mL .  buPROPion (WELLBUTRIN XL) 24 hr tablet 300 mg .  hydrOXYzine (ATARAX/VISTARIL) tablet 50 mg .  lidocaine (LIDODERM) 5 % 1 patch .  magnesium hydroxide (MILK OF MAGNESIA) suspension 30 mL .  multivitamin with minerals tablet 1 tablet .  nicotine (NICODERM CQ - dosed in mg/24 hours) patch 21 mg .  thiamine (VITAMIN B-1) tablet 100 mg .  traZODone (DESYREL) tablet 50 mg  No current outpatient prescriptions on file.  Vital Signs:Blood pressure 102/78, pulse 90, temperature 97.8 F (36.6 C), temperature source Oral, resp. rate 18, height 6\' 1"  (1.854 m), weight 54.4 kg (120 lb), SpO2 100 %.    Lab Results: No results  found for this or any previous visit (from the past 48 hour(s)).  Physical Findings: AIMS: Facial and Oral Movements Muscles of Facial Expression: None, normal Lips and Perioral Area: None, normal Jaw: None, normal Tongue: None, normal,Extremity Movements Upper (arms, wrists, hands, fingers): None, normal Lower (legs, knees, ankles, toes): None, normal, Trunk Movements Neck, shoulders, hips: None, normal, Overall Severity Severity of abnormal movements (highest score from questions above): None, normal Incapacitation due to abnormal movements: None, normal Patient's awareness of abnormal movements (rate only patient's report): No Awareness, Dental Status Current problems with teeth and/or dentures?: No Does patient usually wear dentures?: No  CIWA:  CIWA-Ar Total: 4 COWS:      Assessment/Plan: Patient appears to have made progress and affect is brighter although he still expresses some anxiety about the future. He does spend some time ruminating in his quasi euphoric recall about the extremity of his use disorder. Plan is for him to transition to a long-term program hopefully tomorrow. At present we will continue current medications. Acquanetta SitElizabeth Woods Cyndia Degraff, MD 03/19/2016, 1:29 PM

## 2016-03-19 NOTE — Progress Notes (Signed)
SBAR given to oncoming nurse.

## 2016-03-20 MED ORDER — BUPROPION HCL ER (XL) 300 MG PO TB24: 300.0000 mg | ORAL_TABLET | Freq: Every day | ORAL | 0 refills | Status: AC

## 2016-03-20 MED ORDER — HYDROXYZINE HCL 50 MG PO TABS: 50.0000 mg | ORAL_TABLET | Freq: Four times a day (QID) | ORAL | 0 refills | Status: AC | PRN

## 2016-03-20 MED ORDER — TRAZODONE HCL 50 MG PO TABS: 50.0000 mg | ORAL_TABLET | Freq: Every evening | ORAL | 0 refills | Status: AC | PRN

## 2016-03-20 MED ORDER — NICOTINE 21 MG/24HR TD PT24: 21.0000 mg | MEDICATED_PATCH | Freq: Every day | TRANSDERMAL | 0 refills | Status: AC

## 2016-03-20 MED ORDER — THIAMINE HCL 100 MG PO TABS: 100.0000 mg | ORAL_TABLET | Freq: Every day | ORAL | 0 refills | Status: AC

## 2016-03-20 NOTE — Progress Notes (Signed)
D:  Patient awake and alert; oriented x 4; he denies suicidal and homicidal ideation and AVH; no self-injurous behaviors noted or reported. A:  Medications given as scheduled;  Emotional support provided; encouraged him to seek assistance with needs/concerns. R:  Safety maintained on unit. 

## 2016-03-20 NOTE — Progress Notes (Signed)
Written/verbal discharge instructions, prescriptions, sample medications and follow-up info given to patient with verbalization of understanding;  Patient denies suicidal and homicidal ideation. Suicide Prevention information/materials given to patient  Patient had no belongings.  Discharged in stable condition with [facility] personnel.

## 2016-03-20 NOTE — Tx Team (Signed)
Interdisciplinary Treatment and Diagnostic Plan Update  03/20/2016 Time of Session: 9:30AM Shane Morrow MRN: [id number]  Principal Diagnosis: MDD   Secondary Diagnoses: Active Problems:   Severe single current episode of major depressive disorder, without psychotic features ([place])   Current Medications:  Current Facility-Administered Medications  Medication Dose Route Frequency Provider Last Rate Last Dose  . acetaminophen (TYLENOL) tablet 650 mg  650 mg Oral Q6H PRN Niel Hummer, NP   650 mg at 03/18/16 2205  . alum & mag hydroxide-simeth (MAALOX/MYLANTA) 200-200-20 MG/5ML suspension 30 mL  30 mL Oral Q4H PRN Niel Hummer, NP      . buPROPion (WELLBUTRIN XL) 24 hr tablet 300 mg  300 mg Oral Daily Linard Millers, MD   300 mg at 03/20/16 0749  . hydrOXYzine (ATARAX/VISTARIL) tablet 50 mg  50 mg Oral Q6H PRN Linard Millers, MD   50 mg at 03/20/16 0751  . ibuprofen (ADVIL,MOTRIN) tablet 800 mg  800 mg Oral Q6H PRN Linard Millers, MD   800 mg at 03/19/16 1449  . lidocaine (LIDODERM) 5 % 1 patch  1 patch Transdermal Q24H Norman Clay, MD   1 patch at 03/19/16 1832  . magnesium hydroxide (MILK OF MAGNESIA) suspension 30 mL  30 mL Oral Daily PRN Niel Hummer, NP      . multivitamin with minerals tablet 1 tablet  1 tablet Oral Daily Niel Hummer, NP   1 tablet at 03/20/16 0749  . nicotine (NICODERM CQ - dosed in mg/24 hours) patch 21 mg  21 mg Transdermal Daily Merian Capron, MD   21 mg at 03/20/16 0751  . thiamine (VITAMIN B-1) tablet 100 mg  100 mg Oral Daily Niel Hummer, NP   100 mg at 03/20/16 0751  . traZODone (DESYREL) tablet 50 mg  50 mg Oral QHS PRN Niel Hummer, NP   50 mg at 03/17/16 2104   PTA Medications: Prescriptions Prior to Admission  Medication Sig Dispense Refill Last Dose  . buprenorphine-naloxone (SUBOXONE) 2-0.5 MG SUBL SL tablet Place 2 tablets under the tongue daily. Patient states he takes 13m suboxone     . cyclobenzaprine (FLEXERIL) 5 MG  tablet Take 1 tablet (5 mg total) by mouth 3 (three) times daily as needed. 30 tablet 0   . HYDROcodone-acetaminophen (NORCO/VICODIN) 5-325 MG per tablet Take 1 tablet by mouth every 4 (four) hours as needed for pain. 20 tablet 0   . omeprazole (PRILOSEC) 20 MG capsule Take 1 capsule (20 mg total) by mouth daily. (Patient not taking: Reported on 03/11/2016) 30 capsule 0 Not Taking at Unknown time  . PERCOCET 5-325 MG tablet Take 1 tablet by mouth every 6 (six) hours as needed for severe pain. 30 tablet 0     Patient Stressors: Marital or family conflict Substance abuse  Patient Strengths: Average or above average intelligence Communication skills General fund of knowledge  Treatment Modalities: Medication Management, Group therapy, Case management,  1 to 1 session with clinician, Psychoeducation, Recreational therapy.   Physician Treatment Plan for Primary Diagnosis: <principal problem not specified> Long Term Goal(s): Improvement in symptoms so as ready for discharge mood stabilization. increase insight about drug use   Short Term Goals: Ability to verbalize feelings will improve Ability to demonstrate self-control will improve Ability to identify and develop effective coping behaviors will improve Ability to identify changes in lifestyle to reduce recurrence of condition will improve Ability to disclose and discuss suicidal ideas Ability to demonstrate self-control will  improve Ability to maintain clinical measurements within normal limits will improve  Medication Management: Evaluate patient's response, side effects, and tolerance of medication regimen.  Therapeutic Interventions: 1 to 1 sessions, Unit Group sessions and Medication administration.  Evaluation of Outcomes: Met  Physician Treatment Plan for Secondary Diagnosis: Active Problems:   Severe single current episode of major depressive disorder, without psychotic features ([name])  Long Term Goal(s): Improvement in  symptoms so as ready for discharge mood stabilization. increase insight about drug use   Short Term Goals: Ability to verbalize feelings will improve Ability to demonstrate self-control will improve Ability to identify and develop effective coping behaviors will improve Ability to identify changes in lifestyle to reduce recurrence of condition will improve Ability to disclose and discuss suicidal ideas Ability to demonstrate self-control will improve Ability to maintain clinical measurements within normal limits will improve     Medication Management: Evaluate patient's response, side effects, and tolerance of medication regimen.  Therapeutic Interventions: 1 to 1 sessions, Unit Group sessions and Medication administration.  Evaluation of Outcomes: Met   RN Treatment Plan for Primary Diagnosis: MDD Long Term Goal(s): Knowledge of disease and therapeutic regimen to maintain health will improve  Short Term Goals: Ability to remain free from injury will improve, Ability to disclose and discuss suicidal ideas and Ability to identify and develop effective coping behaviors will improve  Medication Management: RN will administer medications as ordered by provider, will assess and evaluate patient's response and provide education to patient for prescribed medication. RN will report any adverse and/or side effects to prescribing provider.  Therapeutic Interventions: 1 on 1 counseling sessions, Psychoeducation, Medication administration, Evaluate responses to treatment, Monitor vital signs and CBGs as ordered, Perform/monitor CIWA, COWS, AIMS and Fall Risk screenings as ordered, Perform wound care treatments as ordered.  Evaluation of Outcomes: Met   LCSW Treatment Plan for Primary Diagnosis: MDD Long Term Goal(s): Safe transition to appropriate next level of care at discharge, Engage patient in therapeutic group addressing interpersonal concerns.  Short Term Goals: Engage patient in aftercare  planning with referrals and resources, Increase social support, Facilitate patient progression through stages of change regarding substance use diagnoses and concerns and Identify triggers associated with mental health/substance abuse issues  Therapeutic Interventions: Assess for all discharge needs, 1 to 1 time with Social worker, Explore available resources and support systems, Assess for adequacy in community support network, Educate family and significant other(s) on suicide prevention, Complete Psychosocial Assessment, Interpersonal group therapy.  Evaluation of Outcomes: Met/Adequate for discharge.    Progress in Treatment: Attending groups: Yes Participating in groups: Yes, when he attends  Taking medication as prescribed: Yes. Toleration medication: Yes. Family/Significant other contact made: Pt does not know his sister's number and does not consent to additional family contact. SPE completed with pt.  Patient understands diagnosis: Yes. Discussing patient identified problems/goals with staff: Yes. Medical problems stabilized or resolved: Yes. Denies suicidal/homicidal ideation: Yes. during self report.  Issues/concerns per patient self-inventory: No. Other: n/a   New problem(s) identified:  Discharge Plan or Barriers: Pt accepted to [facility] for today; 21 day supply needed. Transportation will arrive by 2:15PM to transport him to [facility].  Reason for Continuation of Hospitalization: none  Estimated Length of Stay: d/c today  Attendees: Patient: 03/20/2016 8:50 AM  Physician: Dr. Sharolyn Douglas MD 03/20/2016 8:50 AM  Nursing: Rivka Spring RN 03/20/2016 8:50 AM  RN Care Manager: Lars Pinks CM 03/20/2016 8:50 AM  Social Worker: Press photographer, LCSW 03/20/2016 8:50 AM  Recreational  Therapist:  03/20/2016 8:50 AM  Other:  03/20/2016 8:50 AM  Other:  03/20/2016 8:50 AM  Other: 03/20/2016 8:50 AM    Scribe for Treatment Team: [place], LCSW 03/20/2016 8:50 AM
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
## 2016-03-20 NOTE — [facility] Suicide Risk Assessment (Signed)
[facility]   Principal Problem: Severe single current episode of major depressive disorder, without psychotic features [facility](HCC) Discharge Diagnoses:  Patient Active Problem List   Diagnosis Date Noted  . Severe single current episode of major depressive disorder, without psychotic features (HCC) [F32.2] 03/10/2016    Total Time spent with patient: 15 minutes  Musculoskeletal: Strength & Muscle Tone: within normal limits Gait & Station: normal Patient leans: N/A  Psychiatric Specialty Exam: ROS  Blood pressure 114/82, pulse (!) 101, temperature 98.4 F (36.9 C), temperature source Oral, resp. rate 16, height 6\' 1"  (1.854 m), weight 54.4 kg (120 lb), SpO2 100 %.Body mass index is 15.83 kg/m.  General Appearance: Casual  Eye Contact::  Good  Speech:  Clear and Coherent409  Volume:  Normal  Mood:  Euthymic  Affect:  Congruent  Thought Process:  Coherent  Orientation:  Full (Time, Place, and Person)  Thought Content:  Negative  Suicidal Thoughts:  No  Homicidal Thoughts:  No  Memory:  Negative  Judgement:  Fair  Insight:  Fair  Psychomotor Activity:  Normal  Concentration:  Good  Recall:  Good  Fund of Knowledge:Good  Language: Good  Akathisia:  No  Handed:  Right  AIMS (if indicated):     Assets:  Resilience  Sleep:  Number of Hours: 5.75  Cognition: WNL  ADL's:  Intact   Mental Status Per Nursing Assessment::   On Admission:     Demographic Factors:  Male and Caucasian  Loss Factors: Loss of significant relationship  Historical Factors: NA  Risk Reduction Factors:   NA  Continued Clinical Symptoms:  Alcohol/Substance Abuse/Dependencies  Cognitive Features That Contribute To Risk:  None    Suicide Risk:  Mild:  Suicidal ideation of limited frequency, intensity, duration, and specificity.  There are no identifiable plans, no associated intent, mild dysphoria and related symptoms, good self-control (both objective and subjective  assessment), few other risk factors, and identifiable protective factors, including available and accessible social support.  Follow-up Information    [facility] Follow up on 03/20/2016.   Why:  Patient accepted for admission on this date at 2:00PM. [facility] driver will pick patient up and transport to facility. 21 day supply of medications required and have been requested. Thank you.  Contact information: [address] [place], [place] [address] Phone: [phone] Fax: [phone]          Plan Of Care/Follow-up recommendations:  Other:  Patient is discharged to residential treatment for substance use disorder  Acquanetta SitElizabeth Woods Zack Crager, MD 03/20/2016, 10:03 AM

## 2016-03-20 NOTE — Progress Notes (Signed)
CSW was told during treatment team that RN had note to contact police department at patient discharge to notify them of discharge due to warrant for arrest. There is no judge's order in the chart, which is required for us to be able to notify police at patient's discharge. CSW checked with Berneice Heinrichina Tate [facility] and Santa GeneraAnne Cunningham LCSW. CSW and Dr. Mckinley Jewelates MD double checked pt's shadow chart---no documentation indicating that staff must contact law enforcement. Therefore, pt will be discharging to [facility] at 2:00PM today for further inpatient treatment.   Trula SladeHeather Smart, MSW, LCSW Clinical Social Worker 03/20/2016 10:18 AM

## 2016-03-20 NOTE — Progress Notes (Signed)
D: Pt denies SI/HI/AVH. Pt is pleasant and cooperative. Pt stated he was doing good and he knows what he needs to do, but it will be hard to stay away from the drugs.   A: Pt was offered support and encouragement. Pt was given scheduled medications. Pt was encourage to attend groups. Q 15 minute checks were done for safety.   R:Pt attends groups and interacts well with peers and staff. Pt is taking medication. Pt has no complaints.Pt receptive to treatment and safety maintained on unit.

## 2016-03-20 NOTE — Progress Notes (Addendum)
  [facility] Discharge Plan :  Will you be returning to the same living situation after discharge:  No. [facility].  At discharge, do you have transportation home?: Yes,  [facility] transport will pick up pt at 2:15PM today 03/20/16 Do you have the ability to pay for your medications: Yes,  mental health  Release of information consent forms completed and submitted to medical records by CSW.  Follow-up Information    [facility] .   Why:  Referral faxed: 03/12/16. Patient accepted for admission 03/20/2016 at 2:00PM.  Contact information: [address] [place], [place] [address] Phone: [phone] Fax: [phone]          Next level of care provider has access to [facility] Link:no  Safety Planning and Suicide Prevention discussed: Yes,  SPE completed with pt; pt declined to consent to family contact. SPI pamphlet and Mobile Crisis information provided to pt and he was encouraged to share information with support network, ask questions, and talk about any concerns relating to SPE.  Have you used any form of tobacco in the last 30 days? (Cigarettes, Smokeless Tobacco, Cigars, and/or Pipes): Yes  Has patient been referred to the Quitline?: Patient refused referral  Patient has been referred for addiction treatment: Yes  Dwayne Begay N Smart LCSW 03/20/2016, 8:49 AM
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
## 2018-12-15 ENCOUNTER — Inpatient Hospital Stay (HOSPITAL_COMMUNITY): Payer: Self-pay

## 2018-12-15 ENCOUNTER — Inpatient Hospital Stay (HOSPITAL_BASED_OUTPATIENT_CLINIC_OR_DEPARTMENT_OTHER): Payer: Self-pay

## 2018-12-15 ENCOUNTER — Inpatient Hospital Stay
Admission: AD | Admit: 2018-12-15 | Discharge: 2019-01-20 | DRG: 917 | Disposition: E | Payer: Self-pay | Source: Other Acute Inpatient Hospital | Attending: Internal Medicine | Admitting: Internal Medicine

## 2018-12-15 ENCOUNTER — Inpatient Hospital Stay (HOSPITAL_COMMUNITY): Payer: Self-pay | Admitting: Pulmonary Disease

## 2018-12-15 DIAGNOSIS — Z4682 Encounter for fitting and adjustment of non-vascular catheter: Secondary | ICD-10-CM

## 2018-12-15 DIAGNOSIS — I10 Essential (primary) hypertension: Secondary | ICD-10-CM | POA: Diagnosis present

## 2018-12-15 DIAGNOSIS — I200 Unstable angina: Secondary | ICD-10-CM

## 2018-12-15 DIAGNOSIS — F1910 Other psychoactive substance abuse, uncomplicated: Secondary | ICD-10-CM

## 2018-12-15 DIAGNOSIS — I38 Endocarditis, valve unspecified: Secondary | ICD-10-CM

## 2018-12-15 DIAGNOSIS — E872 Acidosis: Secondary | ICD-10-CM | POA: Diagnosis present

## 2018-12-15 DIAGNOSIS — R509 Fever, unspecified: Secondary | ICD-10-CM | POA: Diagnosis not present

## 2018-12-15 DIAGNOSIS — I469 Cardiac arrest, cause unspecified: Secondary | ICD-10-CM

## 2018-12-15 DIAGNOSIS — J9691 Respiratory failure, unspecified with hypoxia: Secondary | ICD-10-CM | POA: Diagnosis present

## 2018-12-15 DIAGNOSIS — G931 Anoxic brain damage, not elsewhere classified: Secondary | ICD-10-CM | POA: Diagnosis present

## 2018-12-15 DIAGNOSIS — T404X1A Poisoning by other synthetic narcotics, accidental (unintentional), initial encounter: Principal | ICD-10-CM | POA: Diagnosis present

## 2018-12-15 DIAGNOSIS — R9082 White matter disease, unspecified: Secondary | ICD-10-CM

## 2018-12-15 DIAGNOSIS — T510X1A Toxic effect of ethanol, accidental (unintentional), initial encounter: Secondary | ICD-10-CM | POA: Diagnosis present

## 2018-12-15 DIAGNOSIS — R402213 Coma scale, best verbal response, none, at hospital admission: Secondary | ICD-10-CM | POA: Diagnosis present

## 2018-12-15 DIAGNOSIS — R402113 Coma scale, eyes open, never, at hospital admission: Secondary | ICD-10-CM | POA: Diagnosis present

## 2018-12-15 DIAGNOSIS — Z20828 Contact with and (suspected) exposure to other viral communicable diseases: Secondary | ICD-10-CM | POA: Diagnosis present

## 2018-12-15 DIAGNOSIS — N179 Acute kidney failure, unspecified: Secondary | ICD-10-CM | POA: Diagnosis present

## 2018-12-15 DIAGNOSIS — R000 Tachycardia, unspecified: Secondary | ICD-10-CM

## 2018-12-15 DIAGNOSIS — I468 Cardiac arrest due to other underlying condition: Secondary | ICD-10-CM | POA: Diagnosis present

## 2018-12-15 DIAGNOSIS — J9692 Respiratory failure, unspecified with hypercapnia: Secondary | ICD-10-CM | POA: Diagnosis present

## 2018-12-15 DIAGNOSIS — T401X4A Poisoning by heroin, undetermined, initial encounter: Secondary | ICD-10-CM

## 2018-12-15 DIAGNOSIS — D72829 Elevated white blood cell count, unspecified: Secondary | ICD-10-CM | POA: Diagnosis present

## 2018-12-15 DIAGNOSIS — I959 Hypotension, unspecified: Secondary | ICD-10-CM

## 2018-12-15 DIAGNOSIS — R740 Nonspecific elevation of levels of transaminase and lactic acid dehydrogenase [LDH]: Secondary | ICD-10-CM | POA: Diagnosis present

## 2018-12-15 DIAGNOSIS — I249 Acute ischemic heart disease, unspecified: Secondary | ICD-10-CM

## 2018-12-15 DIAGNOSIS — Z529 Donor of unspecified organ or tissue: Secondary | ICD-10-CM

## 2018-12-15 DIAGNOSIS — R7989 Other specified abnormal findings of blood chemistry: Secondary | ICD-10-CM | POA: Diagnosis present

## 2018-12-15 DIAGNOSIS — F1110 Opioid abuse, uncomplicated: Secondary | ICD-10-CM | POA: Diagnosis present

## 2018-12-15 DIAGNOSIS — R402313 Coma scale, best motor response, none, at hospital admission: Secondary | ICD-10-CM | POA: Diagnosis present

## 2018-12-15 DIAGNOSIS — K5900 Constipation, unspecified: Secondary | ICD-10-CM

## 2018-12-15 DIAGNOSIS — E876 Hypokalemia: Secondary | ICD-10-CM | POA: Diagnosis present

## 2018-12-15 DIAGNOSIS — T43621A Poisoning by amphetamines, accidental (unintentional), initial encounter: Secondary | ICD-10-CM | POA: Diagnosis present

## 2018-12-15 DIAGNOSIS — I459 Conduction disorder, unspecified: Secondary | ICD-10-CM

## 2018-12-15 DIAGNOSIS — K567 Ileus, unspecified: Secondary | ICD-10-CM

## 2018-12-15 DIAGNOSIS — R739 Hyperglycemia, unspecified: Secondary | ICD-10-CM | POA: Diagnosis present

## 2018-12-15 LAB — CBC WITH DIFF
HCT: 47.7 % (ref 38.9–52.0)
HGB: 16.7 g/dL (ref 13.4–17.5)
MCH: 31.1 pg (ref 26.0–32.0)
MCHC: 35 g/dL (ref 31.0–35.5)
MCV: 88.8 fL (ref 78.0–100.0)
MPV: 10.4 fL (ref 8.7–12.5)
PLATELETS: 297 10*3/uL (ref 150–400)
RBC: 5.37 10*6/uL (ref 4.50–6.10)
RDW-CV: 13.1 % (ref 11.5–15.5)
WBC: 24.3 10*3/uL — ABNORMAL HIGH (ref 3.7–11.0)

## 2018-12-15 LAB — DRUG SCREEN, WITH CONFIRMATION, URINE
AMPHETAMINES URINE: POSITIVE — AB
BARBITURATES URINE: NEGATIVE
BENZODIAZEPINES URINE: NEGATIVE
BUPRENORPHINE URINE: POSITIVE — AB
CANNABINOIDS URINE: POSITIVE — AB
COCAINE METABOLITES URINE: NEGATIVE
CREATININE RANDOM URINE: 77 mg/dL
ECSTASY/MDMA URINE: NEGATIVE
FENTANYL, RANDOM URINE: POSITIVE — AB
METHADONE URINE: NEGATIVE
OPIATES URINE (LOW CUTOFF): NEGATIVE
OXIDANT-ADULTERATION: NEGATIVE
OXYCODONE URINE: NEGATIVE
PH-ADULTERATION: 5 (ref 4.5–9.0)
SPECIFIC GRAVITY-ADULTERATION: 1.007 g/mL (ref 1.005–1.030)

## 2018-12-15 LAB — BASIC METABOLIC PANEL
ANION GAP: 19 mmol/L — ABNORMAL HIGH (ref 4–13)
ANION GAP: 22 mmol/L — ABNORMAL HIGH (ref 4–13)
ANION GAP: 24 mmol/L — ABNORMAL HIGH (ref 4–13)
ANION GAP: 21 mmol/L — ABNORMAL HIGH (ref 4–13)
ANION GAP: 18 mmol/L — ABNORMAL HIGH (ref 4–13)
BUN/CREA RATIO: 12 (ref 6–22)
BUN/CREA RATIO: 12 (ref 6–22)
BUN/CREA RATIO: 12 (ref 6–22)
BUN/CREA RATIO: 13 (ref 6–22)
BUN/CREA RATIO: 18 (ref 6–22)
BUN: 21 mg/dL (ref 8–25)
BUN: 23 mg/dL (ref 8–25)
BUN: 25 mg/dL (ref 8–25)
BUN: 27 mg/dL — ABNORMAL HIGH (ref 8–25)
BUN: 30 mg/dL — ABNORMAL HIGH (ref 8–25)
CALCIUM: 9.9 mg/dL (ref 8.5–10.2)
CALCIUM: 10.1 mg/dL (ref 8.5–10.2)
CALCIUM: 10.5 mg/dL — ABNORMAL HIGH (ref 8.5–10.2)
CALCIUM: 10.6 mg/dL — ABNORMAL HIGH (ref 8.5–10.2)
CALCIUM: 10.1 mg/dL (ref 8.5–10.2)
CHLORIDE: 101 mmol/L (ref 96–111)
CHLORIDE: 100 mmol/L (ref 96–111)
CHLORIDE: 100 mmol/L (ref 96–111)
CHLORIDE: 105 mmol/L (ref 96–111)
CHLORIDE: 106 mmol/L (ref 96–111)
CO2 TOTAL: 14 mmol/L — ABNORMAL LOW (ref 22–32)
CO2 TOTAL: 13 mmol/L — ABNORMAL LOW (ref 22–32)
CO2 TOTAL: 13 mmol/L — ABNORMAL LOW (ref 22–32)
CO2 TOTAL: 14 mmol/L — ABNORMAL LOW (ref 22–32)
CO2 TOTAL: 19 mmol/L — ABNORMAL LOW (ref 22–32)
CREATININE: 1.75 mg/dL — ABNORMAL HIGH (ref 0.62–1.27)
CREATININE: 2 mg/dL — ABNORMAL HIGH (ref 0.62–1.27)
CREATININE: 2.13 mg/dL — ABNORMAL HIGH (ref 0.62–1.27)
CREATININE: 2.02 mg/dL — ABNORMAL HIGH (ref 0.62–1.27)
CREATININE: 1.65 mg/dL — ABNORMAL HIGH (ref 0.62–1.27)
ESTIMATED GFR: 49 mL/min/{1.73_m2} — ABNORMAL LOW (ref >60)
ESTIMATED GFR: 41 mL/min/{1.73_m2} — ABNORMAL LOW (ref >60)
ESTIMATED GFR: 38 mL/min/{1.73_m2} — ABNORMAL LOW (ref >60)
ESTIMATED GFR: 41 mL/min/{1.73_m2} — ABNORMAL LOW (ref >60)
ESTIMATED GFR: 52 mL/min/{1.73_m2} — ABNORMAL LOW (ref >60)
GLUCOSE: 557 mg/dL (ref 65–139)
GLUCOSE: 570 mg/dL (ref 65–139)
GLUCOSE: 490 mg/dL (ref 65–139)
GLUCOSE: 216 mg/dL — ABNORMAL HIGH (ref 65–139)
GLUCOSE: 65 mg/dL (ref 65–139)
POTASSIUM: 3 mmol/L — ABNORMAL LOW (ref 3.5–5.1)
POTASSIUM: 3 mmol/L — ABNORMAL LOW (ref 3.5–5.1)
POTASSIUM: 3.4 mmol/L — ABNORMAL LOW (ref 3.5–5.1)
POTASSIUM: 4.2 mmol/L (ref 3.5–5.1)
POTASSIUM: 3.7 mmol/L (ref 3.5–5.1)
SODIUM: 134 mmol/L — ABNORMAL LOW (ref 136–145)
SODIUM: 135 mmol/L — ABNORMAL LOW (ref 136–145)
SODIUM: 137 mmol/L (ref 136–145)
SODIUM: 140 mmol/L (ref 136–145)
SODIUM: 143 mmol/L (ref 136–145)

## 2018-12-15 LAB — SALICYLATE ACID LEVEL: SALICYLATE LEVEL: <5 mg/dL

## 2018-12-15 LAB — VENOUS BLOOD GAS/LACTATE/CO-OX/LYTES (NA/K/CA/CL/GLUC) (TEMP COMP) - ORS ONLY
(T) PCO2: 28 mmHg — ABNORMAL LOW (ref 41.0–51.0)
(T) PO2: 57 mmHg — ABNORMAL HIGH (ref 35.0–50.0)
BASE DEFICIT: 11.9 mmol/L — ABNORMAL HIGH (ref ?–3.0)
BICARBONATE (VENOUS): 15 mmol/L — ABNORMAL LOW (ref 22.0–26.0)
CARBOXYHEMOGLOBIN: 1.2 % (ref 0.0–2.5)
CHLORIDE: 102 mmol/L (ref 101–111)
GLUCOSE: 306 mg/dL (ref 60–105)
HEMOGLOBIN: 17.7 g/dL (ref 12.0–18.0)
IONIZED CALCIUM: 1.26 mmol/L (ref 1.10–1.35)
LACTATE: 12.1 mmol/L — ABNORMAL HIGH (ref 0.0–1.3)
MET-HEMOGLOBIN: 0.6 % (ref 0.0–2.0)
O2CT: 19.8 % — ABNORMAL HIGH (ref 6.7–17.5)
OXYHEMOGLOBIN: 79.8 % (ref 40.0–80.0)
PCO2 (VENOUS): 26 mmHg — ABNORMAL LOW (ref 41.00–51.00)
PH (T): 7.27 — ABNORMAL LOW (ref 7.31–7.41)
PH (VENOUS): 7.3 — ABNORMAL LOW (ref 7.31–7.41)
PO2 (VENOUS): 50 mmHg (ref 35.0–50.0)
SODIUM: 136 mmol/L — ABNORMAL LOW (ref 137–145)
TEMPERATURE, COMP: 38.9 C (ref 15.0–40.0)
WHOLE BLOOD POTASSIUM: 4 mmol/L (ref 3.5–4.6)

## 2018-12-15 LAB — HEPATIC FUNCTION PANEL
ALBUMIN: 4.2 g/dL (ref 3.5–5.0)
ALKALINE PHOSPHATASE: 112 U/L (ref 45–115)
ALT (SGPT): 92 U/L — ABNORMAL HIGH (ref <55)
AST (SGOT): 188 U/L — ABNORMAL HIGH (ref 8–48)
BILIRUBIN DIRECT: 0.2 mg/dL (ref <0.3)
BILIRUBIN TOTAL: 0.6 mg/dL (ref 0.3–1.3)
PROTEIN TOTAL: 8 g/dL (ref 6.4–8.3)

## 2018-12-15 LAB — TROPONIN-I
TROPONIN I: 369 ng/L (ref 0–30)
TROPONIN I: 2428 ng/L (ref 0–30)
TROPONIN I: 13522 ng/L (ref 0–30)

## 2018-12-15 LAB — ECG 12-LEAD
Atrial Rate: 114 {beats}/min
Atrial Rate: 108 {beats}/min
Calculated P Axis: 82 degrees
Calculated P Axis: 80 degrees
Calculated R Axis: 84 degrees
Calculated R Axis: 82 degrees
Calculated T Axis: 69 degrees
Calculated T Axis: 67 degrees
PR Interval: 144 ms
PR Interval: 138 ms
QRS Duration: 94 ms
QRS Duration: 90 ms
QT Interval: 370 ms
QT Interval: 384 ms
QTC Calculation: 509 ms
QTC Calculation: 514 ms
Ventricular rate: 114 {beats}/min
Ventricular rate: 108 {beats}/min

## 2018-12-15 LAB — THYROID STIMULATING HORMONE WITH FREE T4 REFLEX: TSH: 0.179 u[IU]/mL — ABNORMAL LOW (ref 0.350–5.000)

## 2018-12-15 LAB — ACETAMINOPHEN LEVEL: ACETAMINOPHEN LEVEL: <5 ug/mL — ABNORMAL LOW (ref 10–30)

## 2018-12-15 LAB — PT/INR
INR: 1.06 (ref 0.80–1.20)
PROTHROMBIN TIME: 12.2 s (ref 9.1–13.9)

## 2018-12-15 LAB — POTASSIUM
POTASSIUM: 3 mmol/L — ABNORMAL LOW (ref 3.5–5.1)
POTASSIUM: 4.2 mmol/L (ref 3.5–5.1)
POTASSIUM: 3.7 mmol/L (ref 3.5–5.1)

## 2018-12-15 LAB — MAGNESIUM
MAGNESIUM: 1.5 mg/dL — ABNORMAL LOW (ref 1.6–2.6)
MAGNESIUM: 1.5 mg/dL — ABNORMAL LOW (ref 1.6–2.6)
MAGNESIUM: 2.6 mg/dL (ref 1.6–2.6)
MAGNESIUM: 2.8 mg/dL — ABNORMAL HIGH (ref 1.6–2.6)

## 2018-12-15 LAB — VENOUS BLOOD GAS
%FIO2 (VENOUS): 50 %
%FIO2 (VENOUS): 30 %
%FIO2 (VENOUS): 30 %
%FIO2 (VENOUS): 30 %
BASE DEFICIT: 8 mmol/L — ABNORMAL HIGH (ref ?–3.0)
BASE DEFICIT: 13 mmol/L — ABNORMAL HIGH (ref ?–3.0)
BASE DEFICIT: 10.4 mmol/L — ABNORMAL HIGH (ref ?–3.0)
BASE DEFICIT: 5.1 mmol/L — ABNORMAL HIGH (ref ?–3.0)
BICARBONATE (VENOUS): 18.6 mmol/L — ABNORMAL LOW (ref 22.0–26.0)
BICARBONATE (VENOUS): 14.2 mmol/L — ABNORMAL LOW (ref 22.0–26.0)
BICARBONATE (VENOUS): 16.1 mmol/L — ABNORMAL LOW (ref 22.0–26.0)
BICARBONATE (VENOUS): 19.8 mmol/L — ABNORMAL LOW (ref 22.0–26.0)
PCO2 (VENOUS): 26 mmHg — ABNORMAL LOW (ref 41.00–51.00)
PCO2 (VENOUS): 28 mmHg — ABNORMAL LOW (ref 41.00–51.00)
PCO2 (VENOUS): 31 mmHg — ABNORMAL LOW (ref 41.00–51.00)
PCO2 (VENOUS): 43 mmHg (ref 41.00–51.00)
PH (VENOUS): 7.38 (ref 7.31–7.41)
PH (VENOUS): 7.26 — ABNORMAL LOW (ref 7.31–7.41)
PH (VENOUS): 7.29 — ABNORMAL LOW (ref 7.31–7.41)
PH (VENOUS): 7.3 — ABNORMAL LOW (ref 7.31–7.41)
PO2 (VENOUS): 66 mmHg — ABNORMAL HIGH (ref 35.0–50.0)
PO2 (VENOUS): 47 mmHg (ref 35.0–50.0)
PO2 (VENOUS): 42 mmHg (ref 35.0–50.0)
PO2 (VENOUS): 33 mmHg — ABNORMAL LOW (ref 35.0–50.0)

## 2018-12-15 LAB — LACTIC ACID - FIRST REFLEX: LACTIC ACID: 11.7 mmol/L (ref 0.5–2.2)

## 2018-12-15 LAB — BETA-HYDROXYBUTYRATE: BETA-HYDROXYBUTYRATE: 0.3 mmol/L (ref <0.40)

## 2018-12-15 LAB — MANUAL DIFF AND MORPHOLOGY-SYSMEX
BASOPHIL #: <0.1 10*3/uL (ref <=0.20)
BASOPHIL %: 0 %
EOSINOPHIL #: <0.1 10*3/uL (ref <=0.50)
EOSINOPHIL %: 0 %
LYMPHOCYTE #: 2.19 10*3/uL (ref 1.00–4.80)
LYMPHOCYTE %: 9 %
MONOCYTE #: 0.97 10*3/uL (ref 0.20–1.10)
MONOCYTE %: 4 %
NEUTROPHIL #: 21.14 10*3/uL — ABNORMAL HIGH (ref 1.50–7.70)
NEUTROPHIL %: 87 %
NRBC FROM MANUAL DIFF: 0 per 100 WBC
RBC MORPHOLOGY: NORMAL

## 2018-12-15 LAB — LDH: LDH: 623 U/L — ABNORMAL HIGH (ref 125–220)

## 2018-12-15 LAB — CREATINE KINASE (CK), TOTAL, SERUM OR PLASMA
CREATINE KINASE: 196 U/L (ref 45–225)
CREATINE KINASE: 361 U/L — ABNORMAL HIGH (ref 45–225)
CREATINE KINASE: 650 U/L — ABNORMAL HIGH (ref 45–225)
CREATINE KINASE: 834 U/L — ABNORMAL HIGH (ref 45–225)

## 2018-12-15 LAB — PTT (PARTIAL THROMBOPLASTIN TIME): APTT: 22.6 s — ABNORMAL LOW (ref 24.2–37.5)

## 2018-12-15 LAB — POC BLOOD GLUCOSE (RESULTS)
GLUCOSE, POC: 503 mg/dL (ref 70–105)
GLUCOSE, POC: 485 mg/dL (ref 70–105)
GLUCOSE, POC: 527 mg/dL (ref 70–105)
GLUCOSE, POC: 420 mg/dL (ref 70–105)
GLUCOSE, POC: 331 mg/dL — ABNORMAL HIGH (ref 70–105)
GLUCOSE, POC: 264 mg/dL — ABNORMAL HIGH (ref 70–105)
GLUCOSE, POC: 256 mg/dL — ABNORMAL HIGH (ref 70–105)
GLUCOSE, POC: 199 mg/dL — ABNORMAL HIGH (ref 70–105)
GLUCOSE, POC: 138 mg/dL — ABNORMAL HIGH (ref 70–105)
GLUCOSE, POC: 140 mg/dL — ABNORMAL HIGH (ref 70–105)
GLUCOSE, POC: 69 mg/dL — ABNORMAL LOW (ref 70–105)
GLUCOSE, POC: 84 mg/dL (ref 70–105)
GLUCOSE, POC: 166 mg/dL — ABNORMAL HIGH (ref 70–105)

## 2018-12-15 LAB — OSMOLALITY: OSMOLALITY, BLOOD: 310 mosm/kg — ABNORMAL HIGH (ref 280–300)

## 2018-12-15 LAB — PHOSPHORUS
PHOSPHORUS: 0.7 mg/dL — ABNORMAL LOW (ref 2.4–4.7)
PHOSPHORUS: 0.6 mg/dL — ABNORMAL LOW (ref 2.4–4.7)
PHOSPHORUS: 2 mg/dL — ABNORMAL LOW (ref 2.4–4.7)
PHOSPHORUS: 2.8 mg/dL (ref 2.4–4.7)

## 2018-12-15 LAB — THYROXINE, FREE (FREE T4): THYROXINE (T4), FREE: 0.84 ng/dL (ref 0.70–1.25)

## 2018-12-15 LAB — TRICYCLIC SCREEN: TRICYCLICS SCREEN BLOOD: <40 ng/mL

## 2018-12-15 LAB — PROCALCITONIN ALGORITHM: PROCALCITONIN: 8.69 ng/mL — ABNORMAL HIGH (ref <0.50)

## 2018-12-15 LAB — URINE HOLD

## 2018-12-15 LAB — LIPASE: LIPASE: 49 U/L (ref 10–80)

## 2018-12-15 LAB — PROCALCITONIN REFLEX 24HR: PROCALCITONIN: 31.41 ng/mL — ABNORMAL HIGH (ref <0.50)

## 2018-12-15 LAB — ETHANOL, SERUM/PLASMA: ETHANOL: <10 mg/dL (ref <10)

## 2018-12-15 LAB — LACTIC ACID LEVEL W/ REFLEX FOR LEVEL >2.0: LACTIC ACID: 8.7 mmol/L (ref 0.5–2.2)

## 2018-12-15 LAB — VENOUS BLOOD GAS/LACTATE/CO-OX/LYTES (NA/K/CA/CL/GLUC) (TEMP COMP): %FIO2 (VENOUS): 30 %

## 2018-12-15 LAB — ETHANOL, SERUM: ETHANOL: NOT DETECTED

## 2018-12-15 LAB — TRIGLYCERIDE: TRIGLYCERIDES: 225 mg/dL — ABNORMAL HIGH (ref <=150)

## 2018-12-15 LAB — FIBRINOGEN: FIBRINOGEN: 266 mg/dL (ref 200–400)

## 2018-12-15 MED ORDER — DOCUSATE SODIUM 100 MG CAPSULE
100.0000 mg | ORAL_CAPSULE | Freq: Two times a day (BID) | ORAL | Status: DC
Start: 2018-12-15 — End: 2018-12-16
  Administered 2018-12-15 – 2018-12-16 (×2): 0 mg via ORAL

## 2018-12-15 MED ORDER — LACTULOSE 20 GRAM/30 ML ORAL SOLUTION
30.00 mL | Freq: Two times a day (BID) | ORAL | Status: DC
Start: 2018-12-15 — End: 2018-12-16
  Administered 2018-12-15 – 2018-12-16 (×2): 0 mL via ORAL
  Filled 2018-12-15: qty 30

## 2018-12-15 MED ORDER — INSULIN LISPRO 100 UNIT/ML SUB-Q SSIP
0.00 [IU] | INJECTION | SUBCUTANEOUS | Status: DC | PRN
Start: 2018-12-15 — End: 2018-12-22
  Administered 2018-12-15 – 2018-12-18 (×6): 2 [IU] via SUBCUTANEOUS
  Administered 2018-12-18: 4 [IU] via SUBCUTANEOUS
  Administered 2018-12-18: 18:00:00 2 [IU] via SUBCUTANEOUS
  Administered 2018-12-18: 4 [IU] via SUBCUTANEOUS
  Administered 2018-12-19 – 2018-12-20 (×7): 2 [IU] via SUBCUTANEOUS
  Administered 2018-12-20 (×2): 4 [IU] via SUBCUTANEOUS
  Administered 2018-12-21 – 2018-12-22 (×6): 2 [IU] via SUBCUTANEOUS
  Administered 2018-12-22: 4 [IU] via SUBCUTANEOUS
  Filled 2018-12-15: qty 3

## 2018-12-15 MED ORDER — DEXTROSE 50 % IN WATER (D50W) INTRAVENOUS SYRINGE
12.5000 g | INJECTION | INTRAVENOUS | Status: DC | PRN
Start: 2018-12-15 — End: 2018-12-15

## 2018-12-15 MED ORDER — SODIUM CHLORIDE 0.9 % (FLUSH) INJECTION SYRINGE
2.0000 mL | INJECTION | Freq: Three times a day (TID) | INTRAMUSCULAR | Status: DC
Start: 2018-12-15 — End: 2018-12-22
  Administered 2018-12-15: 07:00:00 2 mL
  Administered 2018-12-15: 22:00:00 0 mL
  Administered 2018-12-15 – 2018-12-16 (×2): 2 mL
  Administered 2018-12-16: 0 mL
  Administered 2018-12-16 – 2018-12-17 (×2): 2 mL
  Administered 2018-12-17: 06:00:00 0 mL
  Administered 2018-12-17: 2 mL
  Administered 2018-12-18: 0 mL
  Administered 2018-12-18 – 2018-12-21 (×9): 2 mL
  Administered 2018-12-21 (×2): 0 mL
  Administered 2018-12-22: 2 mL

## 2018-12-15 MED ORDER — DEXTROSE 50 % IN WATER (D50W) INTRAVENOUS SYRINGE
12.5000 g | INJECTION | INTRAVENOUS | Status: AC
Start: 2018-12-15 — End: 2018-12-15
  Administered 2018-12-15: 25 mL via INTRAVENOUS

## 2018-12-15 MED ORDER — FAMOTIDINE 40 MG/5 ML (8 MG/ML) ORAL SUSPENSION
20.00 mg | INHALATION_SUSPENSION | Freq: Two times a day (BID) | ORAL | Status: DC
Start: 2018-12-15 — End: 2018-12-22
  Administered 2018-12-15 – 2018-12-22 (×15): 20 mg via GASTROSTOMY
  Filled 2018-12-15 (×16): qty 2.5

## 2018-12-15 MED ORDER — INSULIN U-100 REGULAR HUMAN 100 UNIT/ML INJECTION SOLUTION
5.0000 [IU] | Freq: Once | INTRAMUSCULAR | Status: AC
Start: 2018-12-15 — End: 2018-12-15
  Administered 2018-12-15: 5 [IU] via INTRAVENOUS

## 2018-12-15 MED ORDER — FENTANYL (PF) 50 MCG/ML INTRAVENOUS SOLUTION
0.2000 ug/kg/h | INTRAVENOUS | Status: DC
Start: 2018-12-16 — End: 2018-12-16
  Administered 2018-12-15: 0.2 ug/kg/h via INTRAVENOUS
  Administered 2018-12-16: 08:00:00 0 ug/kg/h via INTRAVENOUS
  Administered 2018-12-16: 0.6 ug/kg/h via INTRAVENOUS
  Administered 2018-12-16: 1 ug/kg/h via INTRAVENOUS
  Administered 2018-12-16: 0.4 ug/kg/h via INTRAVENOUS
  Administered 2018-12-16: 3 ug/kg/h via INTRAVENOUS
  Administered 2018-12-16: 01:00:00 0.8 ug/kg/h via INTRAVENOUS
  Filled 2018-12-15: qty 50

## 2018-12-15 MED ORDER — CHLORHEXIDINE GLUCONATE 0.12 % MOUTHWASH
15.00 mL | MOUTHWASH | Freq: Two times a day (BID) | Status: DC
Start: 2018-12-15 — End: 2018-12-22
  Administered 2018-12-15 – 2018-12-22 (×15): 15 mL via ORAL
  Filled 2018-12-15 (×15): qty 15

## 2018-12-15 MED ORDER — HEPARIN (PORCINE) 5,000 UNIT/ML INJECTION SOLUTION
5000.0000 [IU] | Freq: Three times a day (TID) | INTRAMUSCULAR | Status: DC
Start: 2018-12-15 — End: 2018-12-22
  Administered 2018-12-15 – 2018-12-18 (×10): 5000 [IU] via SUBCUTANEOUS
  Administered 2018-12-18: 0 [IU] via SUBCUTANEOUS
  Administered 2018-12-19 – 2018-12-22 (×10): 5000 [IU] via SUBCUTANEOUS
  Administered 2018-12-22: 14:00:00
  Filled 2018-12-15 (×6): qty 1
  Filled 2018-12-15: qty 12
  Filled 2018-12-15 (×14): qty 1

## 2018-12-15 MED ORDER — SODIUM CHLORIDE 0.9 % INJECTION SOLUTION
2.00 mL | INTRAMUSCULAR | Status: AC
Start: 2018-12-15 — End: 2018-12-15
  Administered 2018-12-15: 2 mL via INTRAVENOUS

## 2018-12-15 MED ORDER — SODIUM CHLORIDE 0.9 % INTRAVENOUS SOLUTION
3.0000 g | Freq: Four times a day (QID) | INTRAVENOUS | Status: DC
Start: 2018-12-15 — End: 2018-12-17
  Administered 2018-12-15 (×2): 0 g via INTRAVENOUS
  Administered 2018-12-15: 13:00:00 3 g via INTRAVENOUS
  Administered 2018-12-15: 0 g via INTRAVENOUS
  Administered 2018-12-15 – 2018-12-16 (×3): 3 g via INTRAVENOUS
  Administered 2018-12-16 (×2): 0 g via INTRAVENOUS
  Administered 2018-12-16: 3 g via INTRAVENOUS
  Administered 2018-12-16: 0 g via INTRAVENOUS
  Administered 2018-12-16 (×2): 3 g via INTRAVENOUS
  Administered 2018-12-16 – 2018-12-17 (×2): 0 g via INTRAVENOUS
  Administered 2018-12-17 (×2): 3 g via INTRAVENOUS
  Administered 2018-12-17: 0 g via INTRAVENOUS
  Filled 2018-12-15 (×10): qty 20

## 2018-12-15 MED ORDER — SODIUM CHLORIDE 0.9 % INTRAVENOUS SOLUTION
3.0000 [IU]/h | INTRAVENOUS | Status: DC
Start: 2018-12-15 — End: 2018-12-15
  Administered 2018-12-15: 17:00:00 0 [IU]/h via INTRAVENOUS
  Administered 2018-12-15: 2.5 [IU]/h via INTRAVENOUS
  Administered 2018-12-15: 4 [IU]/h via INTRAVENOUS
  Administered 2018-12-15: 3 [IU]/h via INTRAVENOUS
  Administered 2018-12-15: 5 [IU]/h via INTRAVENOUS
  Filled 2018-12-15: qty 2.5

## 2018-12-15 MED ORDER — POTASSIUM CHLORIDE 10 MEQ/100ML IN STERILE WATER INTRAVENOUS PIGGYBACK
10.0000 meq | INJECTION | Freq: Once | INTRAVENOUS | Status: AC
Start: 2018-12-15 — End: 2018-12-15
  Administered 2018-12-15: 0 meq via INTRAVENOUS
  Administered 2018-12-15: 10 meq via INTRAVENOUS
  Filled 2018-12-15: qty 100

## 2018-12-15 MED ORDER — ELECTROLYTE-A INTRAVENOUS SOLUTION
INTRAVENOUS | Status: DC
Start: 2018-12-15 — End: 2018-12-17
  Administered 2018-12-15 – 2018-12-17 (×4): via INTRAVENOUS
  Administered 2018-12-17: 0 via INTRAVENOUS

## 2018-12-15 MED ORDER — MAGNESIUM SULFATE 2 GRAM/50 ML (4 %) IN WATER INTRAVENOUS PIGGYBACK
2.0000 g | INJECTION | Freq: Once | INTRAVENOUS | Status: AC
Start: 2018-12-15 — End: 2018-12-15
  Administered 2018-12-15: 2 g via INTRAVENOUS
  Administered 2018-12-15: 0 g via INTRAVENOUS
  Filled 2018-12-15: qty 50

## 2018-12-15 MED ORDER — INSULIN U-100 REGULAR HUMAN 100 UNIT/ML INJECTION SOLUTION
2.0000 [IU] | INTRAMUSCULAR | Status: DC | PRN
Start: 2018-12-15 — End: 2018-12-15
  Filled 2018-12-15: qty 3

## 2018-12-15 MED ORDER — DEXTROSE 50 % IN WATER (D50W) INTRAVENOUS SYRINGE
INJECTION | INTRAVENOUS | Status: AC
Start: 2018-12-15 — End: 2018-12-16
  Filled 2018-12-15: qty 50

## 2018-12-15 MED ORDER — FENTANYL (PF) 50 MCG/ML INJECTION SOLUTION
50.0000 ug | INTRAMUSCULAR | Status: DC | PRN
Start: 2018-12-15 — End: 2018-12-16

## 2018-12-15 MED ORDER — AMLODIPINE 5 MG TABLET
2.5000 mg | ORAL_TABLET | Freq: Every day | ORAL | Status: DC
Start: 2018-12-15 — End: 2018-12-16
  Administered 2018-12-15: 2.5 mg via ORAL
  Filled 2018-12-15 (×2): qty 1

## 2018-12-15 MED ORDER — SODIUM CHLORIDE 0.9 % (FLUSH) INJECTION SYRINGE
2.0000 mL | INJECTION | INTRAMUSCULAR | Status: DC | PRN
Start: 2018-12-15 — End: 2018-12-22

## 2018-12-15 MED ORDER — FENTANYL (PF) 50 MCG/ML INJECTION SOLUTION
100.0000 ug | INTRAMUSCULAR | Status: AC
Start: 2018-12-16 — End: 2018-12-15
  Administered 2018-12-15: 100 ug via INTRAVENOUS
  Filled 2018-12-15: qty 2

## 2018-12-15 MED ORDER — SODIUM CHLORIDE 0.9 % INTRAVENOUS SOLUTION
30.0000 mmol | Freq: Once | INTRAVENOUS | Status: AC
Start: 2018-12-15 — End: 2018-12-15
  Administered 2018-12-15: 30 mmol via INTRAVENOUS
  Administered 2018-12-15: 0 mmol via INTRAVENOUS
  Filled 2018-12-15: qty 10

## 2018-12-15 MED ORDER — DEXMEDETOMIDINE 400 MCG/100 ML (4 MCG/ML) IN 0.9 % SODIUM CHLORIDE IV
0.2000 ug/kg/h | INTRAVENOUS | Status: DC
Start: 2018-12-15 — End: 2018-12-16
  Administered 2018-12-15: 14:00:00 0.6 ug/kg/h via INTRAVENOUS
  Administered 2018-12-15: 0 ug/kg/h via INTRAVENOUS
  Administered 2018-12-15: 0.2 ug/kg/h via INTRAVENOUS
  Administered 2018-12-15: 0.8 ug/kg/h via INTRAVENOUS
  Administered 2018-12-15: 13:00:00 0.4 ug/kg/h via INTRAVENOUS
  Administered 2018-12-16: 0 ug/kg/h via INTRAVENOUS
  Administered 2018-12-16: 0.6 ug/kg/h via INTRAVENOUS
  Administered 2018-12-16: 0.2 ug/kg/h via INTRAVENOUS
  Administered 2018-12-16: 0.8 ug/kg/h via INTRAVENOUS
  Administered 2018-12-16: 04:00:00 0.4 ug/kg/h via INTRAVENOUS
  Filled 2018-12-15 (×3): qty 100

## 2018-12-15 NOTE — Care Plan (Signed)
Patient GCS 1/1/1 no protective reflexes. CORE notified.   CT head completed. ECHO done today and 24hours continuous EEG initiated.     Therapeutic hypothermia initiated for post cardiac arrest.     Family made aware by MICu that prognosis is poor, but monitoring patient today and tomorrow for neurological improvement and improvement in brain swelling.   Family agreeable to plan of care.

## 2018-12-15 NOTE — Care Management Notes (Signed)
QSOFA SCREENING    PATIENT INFORMATION    PCP: No primary care provider on file.    RESERVATION INFORMATION  Received call from:   Phone:   Referring Provider: Richardean Canal         Referring Provider Phone: [phone]    Transfer Source: [facility]     Transfer Emergent:  Urgent  Transfer Reason: Level of Care not available at Current Facility  Transfer Comments:   Admitted on: 11/29/2018   Pt Class and Level of Care: Emergency Emergency  Diagnosis: Cardiac Arrest       CLINICAL INFORMATION:  PMH: None   Intubated: Intubated, yes  Medications, IVF, Drips: Nicardipine gtt   EKG: Yes - Sinus Tach  IV Access: Yes  Imaging: CXR  Labs Reported: Creatinine 1.3, Glucose 256, BAL 99, Tox Screen + Buprenorphine, Methamphetamine, Cannabis       qSOFA Score >= 2 = Positive              Value               Score    Respiratory Rate >= 22 breaths per minute = 1 point 18 (Intubated)  0   Systolic Blood Pressure <= 100 mmHg = 1 point 231/136 0   Altered Mental Status: GCS <15 = 1 point GCS 3 1   Total Score  1     Serum Lactate Level > = 61mol/L (36 mg/ dL)= Positive:  Initial call level (if not available put NA) 11.1   Followed up call level         Positive Screen: yes  Antibiotics:  no  Fluids/Pressor:  no  Blood Cultures:  no  Serum Lactate:   yes - 11.1    Dr. SAltamese Cabalof [facility] BTammi Sou Patient accepted to MICU 2 Service Attending Dr. ZPete Glatterfor cardiac arrest.       Admitting Pt Class and Level of Care: Inpatient, Intensive Care,   Accepted By: ZOley Balm MICU2    JHilda Blades RN

## 2018-12-15 NOTE — Care Plan (Signed)
MSW completed assessment with Lavonna Monarch- stepmother.  Stanton Kidney stated pt was living with her and his father at address on file until he moved into his own apartment recently.  Pt has no DME and is not active with home health.  Stanton Kidney reported pt has history of alcohol and drug use.  Stanton Kidney stated she approved Leota Jacobsen Sr- father as HCS.  MSW spoke with Leota Jacobsen Sr and appointed him as HCS.  MSW left a message for Towanda Octave- friend [phone].  HCS on chart.      Pt intubated and sedated.  Pt with history of IV drug use.  Cooling protocol.  Continue ABX.    Discharge Plan:  Undetermined at this time      The patient will continue to be evaluated for developing discharge needs.

## 2018-12-15 NOTE — Nurses Notes (Signed)
Repeat FS 84. Per MICU fellow stop insulin gtt and cover with sliding scale insulin.

## 2018-12-15 NOTE — Nurses Notes (Signed)
MICU notified low urine output via text page.

## 2018-12-15 NOTE — Nurses Notes (Signed)
Sister at bedside. Updated on patient status  By MICU and RN. Other sister and mother updated as well.

## 2018-12-15 NOTE — Nurses Notes (Signed)
Critical trop 2,429 reported to MICU. No new orders at this time

## 2018-12-15 NOTE — Nurses Notes (Signed)
ICE packs placed for cooling until artic sun device arrives.

## 2018-12-15 NOTE — Care Plan (Signed)
VENTILATOR - CPAP(PS) / SPONTANEOUS CONTINUOUS    Discontinue   Duration: Until Specified    Priority: Routine       Question Answer Comment   FIO2 (%) 30    Peep(cm/H2O) 5    Pressure Support(cm/H2O) 8    Indications IMPROVE DISTRIBUTION OF VENTILATION    Invasive/Non-Invasive INVASIVE            Patient resting on current settings shown above. Suctions for cloudy thick secretions. No distress at this time. Will continue to wean as tolerable.

## 2018-12-15 NOTE — Nurses Notes (Signed)
FS 69, 1/2 amp D50 ordered IV and ordered to cut gtt to 2.5U/hr.

## 2018-12-15 NOTE — Nurses Notes (Signed)
Notified MICU FS 138 to bridge patient to sub Q insulin. Per MICU keep patient on drip until evening rounds. If at goal or below x2 stick reduce rate by 0.5 U.

## 2018-12-15 NOTE — ACP (Advance Care Planning) (Signed)
Does the Patient have an Advance Directive?: Yes, Patient Does Have Advance Directive for Healthcare Treatment  Type of Advance Directive Completed: *[place] Surrogate  Copy of Advance Directives in Chart?: Yes, Copy on Chart.(Specify in [place] Directive)  Name of MPOA or Healthcare Surrogate: Jon Parrish  Phone Number of [place] or Healthcare Surrogate: [phone]  Patient Requests Assistance in Having Advance Directive Notarized.: Not Applicable

## 2018-12-15 NOTE — Nurses Notes (Signed)
Repeat FS 527. MICU 2 fellow made aware. Orders for insuline gtt to be placed.

## 2018-12-15 NOTE — Nurses Notes (Signed)
Critical Trop 369 text paged to MICU

## 2018-12-15 NOTE — Nurses Notes (Signed)
MICU notified of BP 120/100 with HR in the 50s. Manual BP taken 130/100. Norvasc ordered and precedex gtt stopped. Will continue to monitor.

## 2018-12-15 NOTE — Care Management Notes (Signed)
[place] Management Initial Evaluation    Patient Name: Jon Parrish.  Date of Birth: [birth year]  Sex: male  Date/Time of Admission: 12/12/2018  5:56 AM  Room/Bed: 06/A  Payor: /   No primary care provider on file.    Pharmacy Info:   Preferred Pharmacy     None        Emergency Contact Info:   Extended Emergency Contact Information  Primary Emergency Contact: Jon Parrish  Mobile Phone: [phone]  Relation: Father  Secondary Emergency Contact: Jon Parrish  Work Phone: [phone]  Mobile Phone: [phone]  Relation: Stepmother    History:   Jon Gaillard. is a 38 y.o., male, admitted Cardiac arrest    Height/Weight: 190.5 cm (6\' 3" ) / 76.8 kg (169 lb 5 oz)     LOS: 0 days   Admitting Diagnosis: Cardiac arrest (CMS [facility]) [I46.9]    Assessment:      11/30/2018 1525   Assessment Details   Assessment Type Admission   Date of Care Management Update 12/17/2018   Date of Next DCP Update 12/18/18   Readmission   Is this a readmission? No   Care Management Plan   Discharge Planning Status initial meeting   Projected Discharge Date 2018-12-26   CM will evaluate for rehabilitation potential yes   Patient choice offered to patient/family no   Form for patient choice reviewed/signed and on chart no   Patient aware of possible cost for ambulance transport?  No   Discharge Needs Assessment   Was Referral sent to CPS/CYS? No   Was referral sent to APS? No   Equipment Currently Used at Home none   Equipment Needed After Discharge other (see comments)  (to be determined)   Discharge Facility/Level of Care Needs Undetermined at this time   Transportation Available family or friend will provide   Referral Information   Admission Type inpatient   Address Verified verified-no changes   Arrived From acute hospital, other   [place] not verified   ADVANCE DIRECTIVES   Does the Patient have an Advance Directive? Yes, Patient Does Have Advance Directive for Healthcare  Treatment   Type of Advance Directive Completed 1   Copy of Advance Directives in Chart? 0   Name of [place]   Phone Number of [place] or Healthcare Surrogate [phone]   Patient Requests Assistance in Having Advance Directive Notarized. N/A   LAY CAREGIVER    Appointed Lay Caregiver? I Decline   Employment/Financial   Patient has Prescription Coverage?    (unable to verify)        Name of Insurance Coverage for Medications Jon Parrish reports pt has [employer] Concerns other (see comments)  (Care Manager unable to verify)   [place] an age group to open "lives with" row.  Adult   Lives With alone   Living Arrangements apartment   Able to Return to Prior Arrangements yes   Home Safety   Home Assessment: No Problems Identified   Home Accessibility no concerns   Legal Issues   Do you have a court appointed guardian/conservator? No     MSW completed assessment with Jon Parrish- stepmother.  Jon Parrish stated pt was living with her and his father at address on file until he moved into his own apartment recently.  Pt has no DME and is not active with home  health.  Jon Parrish reported pt has history of alcohol and drug use.  Jon Parrish stated she approved Jon Parrish- father as HCS.  MSW spoke with Jon Parrish and appointed him as HCS.  MSW left a message for Jon Parrish- friend [phone].  HCS on chart.      Pt intubated and sedated.  Pt with history of IV drug use.  Cooling protocol.  Continue ABX.    Discharge Plan:  Undetermined at this time      The patient will continue to be evaluated for developing discharge needs.     Case Manager: Glean Salvohristopher Primus Gritton  Phone: [phone]

## 2018-12-15 NOTE — Nurses Notes (Signed)
MICU fellow notified FS 125ml/dl previous FS 169ml/dl. Continue gtt at 5 until rounds to provide consistent glucose level. Will eval stopping gtt at evening rounds.

## 2018-12-15 NOTE — Nurses Notes (Signed)
Care management discussing HCS with patient's mother.

## 2018-12-15 NOTE — H&P (Signed)
[place]  MICU History and Physical    Name: Jon Parrish.  Age and Gender: 38 y.o. male  Date of Birth: [birth year]  Date of Admission: 12/19/2018  MRN: [id number]  PCP: No primary care provider on file.  Attending: Oley Balm, MD    History obtained by:  Healthcare provider    Reason for MICU Admission/Transfer:  Cardiac arrest    HPI:   Jon Parrish. is a 38 y.o. White male with PMH significant for IV drug abuse presenting with suspected opioid overdose and cardiac arrest.  Per report, patient was missing for 2-3 hours.  Patient was found in-home apneic and pulseless with a needle in his arm.  Patient showed asystole on monitor.  Patient was given 34m Narcan with no response.  Patient was intubated and had a GCS of 3.  ALS was started and after 2 rounds of epinephrine and 1 round of amiodarone was given the patient was in ventricular tachycardia and received a total of 2 defibrillating shocks. EMS was able to obtain ROSC. Patient was then in sinus tachycardia. Upon arrival at the ED patient was given 1 L IV fluid and blood pressure was reportedly 231/136.  Patient's urine drug screen was positive for buprenorphine, methamphetamine, cannabinoids and was found have a BAL of 99. Patient was started on a nicardipine drip for hypertension.  Per report patient's GCS remained 3 with fixed and dilated pupil.  Patient was then transferred for brain death evaluation and organ procurement if appropriate.    ROS:   Review of systems was not obtained due to GCS 3 .    Past Medical/Surgical History:  IVDU        Medications:   Cannot display prior to admission medications because the patient has not been admitted in this contact.        ampicillin-sulbactam (UNASYN) 3 g in NS 100 mL IVPB, 3 g, Intravenous, Q6H  chlorhexidine gluconate (PERIDEX) 0.12% mouthwash, 15 mL, Swish & Spit, 2x/day  dextrose 50% (0.5 g/mL) injection - syringe, 12.5 g, Intravenous, Q1H PRN  electrolyte-A (PLASMALYTE-A) premix infusion, ,  Intravenous, Continuous  famotidine (PEPCID) 446mper 25m40mral liquid, 20 mg, Gastric (NG, OG, PEG, GT), 2x/day  insulin R human 100 units/mL injection, 5 Units, Intravenous, Once  insulin R human 100 units/mL injection, 2-4 Units, Intravenous, Q1H PRN  insulin regular human 250 Units in NS 250 mL infusion, 3 Units/hr, Intravenous, Continuous  magnesium sulfate 2 G in SW 50 mL premix IVPB, 2 g, Intravenous, Once  NS flush syringe, 2 mL, Intracatheter, Q8HRS    And  NS flush syringe, 2-6 mL, Intracatheter, Q1 MIN PRN  potassium phosphate 30 mmol in NS 500 mL IVPB, 30 mmol, Intravenous, Once        Allergies:  Allergies not on file    Family History:  Family Medical History:     None            Social History:  Social History     Tobacco Use   . Smoking status: Not on file   Substance Use Topics   . Alcohol use: Not on file        EXAM:  Temperature: 36.9 C (98.4 F)  Heart Rate: (!) 107  BP (Non-Invasive): (!) 129/105  Respiratory Rate: (!) 24  SpO2: 99 %  General: vital signs reviewed and Intubated, agonal breathing.  Eyes: abnormal findings: 4 and nonreactive.  HENT:ENMT without erythema or injection, mucous membranes moist.  Neck: no  thyromegaly or lymphadenopathy  Lungs: Clear to auscultation bilaterally.   Cardiovascular:    Tachycardia with regular rhythm, no murmurs, rubs or gallops.  Abdomen: Soft, non-tender, non-distended  Extremities: Bilateral lower extremity excoriations.  Needle marks in upper extremities.  Skin: Skin warm and dry  Neurologic: GCS 3, intubated.    Labs:    Results for orders placed or performed during the hospital encounter of 11/26/2018 (from the past 24 hour(s))   HEPATIC FUNCTION PANEL   Result Value Ref Range    ALBUMIN 4.2 3.5 - 5.0 g/dL    ALKALINE PHOSPHATASE 112 45 - 115 U/L    ALT (SGPT) 92 (H) <55 U/L    AST (SGOT) 188 (H) 8 - 48 U/L    BILIRUBIN TOTAL 0.6 0.3 - 1.3 mg/dL    BILIRUBIN DIRECT 0.2 <0.3 mg/dL    PROTEIN TOTAL 8.0 6.4 - 8.3 g/dL   LIPASE   Result Value Ref Range     LIPASE 49 10 - 80 U/L   ACETAMINOPHEN LEVEL   Result Value Ref Range    ACETAMINOPHEN LEVEL <5 (L) 10 - 30 ug/mL   ETHANOL, SERUM   Result Value Ref Range    ETHANOL <10 <10 mg/dL    ETHANOL None Detected    SALICYLATE ACID LEVEL   Result Value Ref Range    SALICYLATE LEVEL <5   mg/dL   TRICYCLIC SCREEN   Result Value Ref Range    TRICYCLICS SCREEN BLOOD <32 No Reference Range Established ng/mL   THYROID STIMULATING HORMONE WITH FREE T4 REFLEX   Result Value Ref Range    TSH 0.179 (L) 0.350 - 5.000 uIU/mL   TROPONIN-I   Result Value Ref Range    TROPONIN I 369 (HH) 0 - 30 ng/L   BASIC METABOLIC PANEL   Result Value Ref Range    SODIUM 134 (L) 136 - 145 mmol/L    POTASSIUM 3.0 (L) 3.5 - 5.1 mmol/L    CHLORIDE 101 96 - 111 mmol/L    CO2 TOTAL 14 (L) 22 - 32 mmol/L    ANION GAP 19 (H) 4 - 13 mmol/L    CALCIUM 9.9 8.5 - 10.2 mg/dL    GLUCOSE 557 (HH) 65 - 139 mg/dL    BUN 21 8 - 25 mg/dL    CREATININE 1.75 (H) 0.62 - 1.27 mg/dL    BUN/CREA RATIO 12 6 - 22    ESTIMATED GFR 49 (L) >60 mL/min/1.61m2   MAGNESIUM   Result Value Ref Range    MAGNESIUM 1.5 (L) 1.6 - 2.6 mg/dL   PHOSPHORUS   Result Value Ref Range    PHOSPHORUS 0.7 (L) 2.4 - 4.7 mg/dL   LACTIC ACID LEVEL W/ REFLEX FOR LEVEL >2.0   Result Value Ref Range    LACTIC ACID 8.7 (HH) 0.5 - 2.2 mmol/L   LDH   Result Value Ref Range    LDH 623 (H) 125 - 220 U/L   TRIGLYCERIDE   Result Value Ref Range    TRIGLYCERIDES 225 (H) <=150 mg/dL   CREATINE KINASE (CK), TOTAL, SERUM   Result Value Ref Range    CREATINE KINASE 196 45 - 225 U/L   BETA-HYDROXYBUTYRATE, PLASMA OR SERUM   Result Value Ref Range    BETA-HYDROXYBUTYRATE 0.30 <0.40 mmol/L   OSMOLALITY   Result Value Ref Range    OSMOLALITY, BLOOD 310 (H) 280 - 300 mOsm/kg   CBC WITH DIFF   Result Value Ref Range  WBC 24.3 (H) 3.7 - 11.0 x10^3/uL    RBC 5.37 4.50 - 6.10 x10^6/uL    HGB 16.7 13.4 - 17.5 g/dL    HCT 47.7 38.9 - 52.0 %    MCV 88.8 78.0 - 100.0 fL    MCH 31.1 26.0 - 32.0 pg    MCHC 35.0 31.0 - 35.5 g/dL     RDW-CV 13.1 11.5 - 15.5 %    PLATELETS 297 150 - 400 x10^3/uL    MPV 10.4 8.7 - 12.5 fL   VENOUS BLOOD GAS   Result Value Ref Range    %FIO2 (VENOUS) 50.0 %    BICARBONATE (VENOUS) 18.6 (L) 22.0 - 26.0 mmol/L    PCO2 (VENOUS) 26.00 (L) 41.00 - 51.00 mm/Hg    PH (VENOUS) 7.38 7.31 - 7.41    PO2 (VENOUS) 66.0 (H) 35.0 - 50.0 mm/Hg    BASE DEFICIT 8.0 (H) -3.0 - 3.0 mmol/L   MANUAL DIFF AND MORPHOLOGY-SYSMEX   Result Value Ref Range    NEUTROPHIL % 87 %    LYMPHOCYTE %  9 %    MONOCYTE % 4 %    EOSINOPHIL % 0 %    BASOPHIL % 0 %    NEUTROPHIL # 21.14 (H) 1.50 - 7.70 x10^3/uL    LYMPHOCYTE # 2.19 1.00 - 4.80 x10^3/uL    MONOCYTE # 0.97 0.20 - 1.10 x10^3/uL    EOSINOPHIL # <0.10 <=0.50 x10^3/uL    BASOPHIL # <0.10 <=0.20 x10^3/uL    NRBC FROM MANUAL DIFF 0 per 100 WBC    RBC MORPHOLOGY Normal RBC and PLT Morphology    POC BLOOD GLUCOSE (RESULTS)   Result Value Ref Range    GLUCOSE, POC 503 (HH) 70 - 105 mg/dl   POC BLOOD GLUCOSE (RESULTS)   Result Value Ref Range    GLUCOSE, POC 485 (HH) 70 - 105 mg/dl   POC BLOOD GLUCOSE (RESULTS)   Result Value Ref Range    GLUCOSE, POC 527 (HH) 70 - 105 mg/dl       Pertinent Studies:    WBC:  24.3  Lactic acid:  8.7  Glucose:  503    Assessment and Plan:  Jon Parrish. is a 38 y.o. White male with PMH significant for IV drug use presenting with drug overdose.    Patient Active Problem List    Diagnosis   . Cardiac arrest (CMS HCC)     Cardiac arrest  hypoxic hypercarbic respiratory failure IV drug abuse  drug overdose  anoxic brain injury  -the cardiac arrest likely secondary to be hypoxic hypercarbic respiratory failure likely sent night to heroin abuse.  -ROSC obtained after epinephrine, Narcan, amiodarone, to defibrillating shocks.  -UDS at outside facility positive for amphetamine, buprenorphine, cannabinoids  -intubated on scene, last seen normal at 2230  -will obtain CT brain, CXR, KUB  -continuous video EEG  -laboratory work including drug screen, tox screen, ABG, CBC,  BMP, troponin  -start Unasyn 3g q.6 hours  -cooling protocol 33-35, 24-48hrs    Leukocytosis  -WBC 24  -Likely secondary to cardiac arrest      Lactic acidosis  -lactic acid 8.7  -likely secondary to cardiac arrest    Hypophosphatemia  hypomagnesemia  -phosphorus 0.7, magnesium 1.5  -likely secondary to cardiac arrest  -will replace as necessary    Transaminitis  -AST 188, ALT 92  -likely secondary to alcohol use    Elevated troponin  -initial troponin 369  -likely secondary to chest compressions/cardiac arrest  -  will trend troponin q.6 hours    AKI  -initial creatinine 1.75  -unknown previous creatinine  -plasmalyte 100 mL/hour  -daily BMP    Hypokalemia  -initial potassium 3  -replace as needed    Pain control/Sedation:  None  F/E/N:  NPO  Bowel regimen:  Pepcid  PPx:  Heparin  ABx:  Unasyn  L/D/A:  8.0 ETT  Drips:  Plasmalyte 100 mL/hour    Code Status:  Full code    Jaci Standard, MD  11/25/2018, 03:35  PGY II  [facility]    POST ROUNDS ADDENDUM:    Patient evaluated on rounds and found to have GCS 3 with decerebrate movements. Sister at bedside and updated.    Continue post-cardiac arrest care.  Plan for TTE, 24 hour cooling, continuous EEG.  Will follow electrolyte Q4H, VBG Q4H, CK and troponin x3.  Will obtain KUB given abdominal distension.  Will start Precedex for BP, tachypnea, tachycardia  Plan for discontinuation of unasyn tomorrow if cultures unremarkable.  Plan to start trickle feeds tomorrow.  CT brain with no acute intracranial process identified.     Jamal Collin, DO  11/22/2018, 13:47  [facility], PGY-4       Staff addendum:    1 yea rold s/p cardiac arrest after being found down with needle in his arm, UTOx positive for meth,cannabinoids and buprenorphine. 15 mins of ACLS with ROSC, but unknown down time. Patient HTN here, intubated ,plan to cool him, EEG,TTE. Met sister at bedside,overall prognosis is poor given his cerebrate posturing. Will  re-prognosticate in 48-72 hrs.    I spend 30 Critical care minutes while the patient was in this condition providing fluids, pressor, drugs and oxygen.I saw and evaluated the patient. Laboratory, hemodynamic, respiratory support, and radiological data were also reviewed and discussed.     Oley Balm ,MD  Assistant Professor,Section of Pulmonary ,Critical Care and Sleep Medicine  Department of Medicine, [facility]

## 2018-12-15 NOTE — Nurses Notes (Signed)
Patient arrived to [facility] via EMS. Nurse at bedside, MICU called. Patient intubated. See VS and assessment in flowsheet.

## 2018-12-15 NOTE — Student (Signed)
[facility]                                                     MICU PROGRESS NOTE    Jon Parrish, Teo., 38 y.o. male  Date of Admission:  12/05/2018  Date of service: 12/02/2018  Date of Birth:  12-10-1980    Subjective: Mr. Jon Parrish is a 38 year old male, PMH of IV drug use, presenting as a transfer from outside facility for cardiac arrest and suspected opioid overdose. Patient was last seen 2-3 hours prior to being found by EMS pulseless, and apneic with a heroin needle in his arm. Patient received Narcan with no response. Patient then received Epinephrine x2 and went into ventricular fibrillation. Subsequently received two defibrillation shock and an amiodarone bolus. At outside facility patient's BP went to 231/136 for which he received nicardipine drip.  This morning patient was GCS 3, with agonal breathing. Sister was at bedside, plan was discussed with her.    Vital Signs:  Temp (24hrs) Max:39.3 C (297.9 F)      Systolic (89QJJ), HER:740 , Min:125 , CXK:481     Diastolic (85UDJ), SHF:026, Min:95, Max:124    Temp  Avg: 38.3 C (100.9 F)  Min: 36.8 C (98.2 F)  Max: 39.3 C (102.7 F)  MAP (Non-Invasive)  Avg: 122 mmHG  Min: 110 mmHG  Max: 133 mmHG  Pulse  Avg: 106.1  Min: 97  Max: 117  Resp  Avg: 23.1  Min: 13  Max: 78  SpO2  Avg: 98.8 %  Min: 94 %  Max: 100 %            Ventilator Settings: Conventional settings:  Set PEEP: 5 cmH2O  Pressure Support: 8 cmH2O  FiO2: 30 %    Blood Gas Results:  Recent Labs     11/30/2018  0626 12/12/2018  0926 11/29/2018  1112   FI02 50.0 30.0 30.0   PH 7.38 7.26* 7.30*   PCO2 26.00* 28.00* 26.00*   PO2 66.0* 47.0 50.0   BICARBONATE 18.6* 14.2* 15.0*   BASEDEFICIT 8.0* 13.0* 11.9*            Current Medications:  ampicillin-sulbactam (UNASYN) 3 g in NS 100 mL IVPB, 3 g, Intravenous, Q6H  chlorhexidine gluconate (PERIDEX) 0.12% mouthwash, 15 mL, Swish & Spit, 2x/day  dexmedetomidine (PRECEDEX) 400 mcg in NS 173m premix  infusion, 0.2 mcg/kg/hr, Intravenous, Continuous  dextrose 50% (0.5 g/mL) injection - syringe, 12.5 g, Intravenous, Q1H PRN  electrolyte-A (PLASMALYTE-A) premix infusion, , Intravenous, Continuous  famotidine (PEPCID) 431mper 36m50mral liquid, 20 mg, Gastric (NG, OG, PEG, GT), 2x/day  heparin 5,000 unit/mL injection, 5,000 Units, Subcutaneous, Q8HRS  insulin R human 100 units/mL injection, 2-4 Units, Intravenous, Q1H PRN  insulin regular human 250 Units in NS 250 mL infusion, 3 Units/hr, Intravenous, Continuous  NS flush syringe, 2 mL, Intracatheter, Q8HRS    And  NS flush syringe, 2-6 mL, Intracatheter, Q1 MIN PRN  perflutren lipid microspheres (DEFINITY) 1.3 mL in NS 10 mL (tot vol) injection, 2 mL, Intravenous, Give in Cardiology  potassium phosphate 30 mmol in NS 500 mL IVPB, 30 mmol, Intravenous, Once        Today's Physical Exam:  General:  Intubated, agonal breathing.  Eyes: non reactive.  Neck: trachea midline, no masses  Lungs: Clear  to auscultation bilaterally.   Cardiovascular:  Tachycardia with regular rhythm, no murmurs, rubs or gallops.  Abdomen: Soft, non-distended  Extremities: Bilateral lower extremity excoriations.  Skin: Skin warm and dry  Neurologic: GCS 3, intubated      Labs:  I have reviewed Labs.    I/O:  I/O last 24 hours:      Intake/Output Summary (Last 24 hours) at 11/27/2018 1246  Last data filed at 12/06/2018 1200  Gross per 24 hour   Intake 512.35 ml   Output 405 ml   Net 107.35 ml     I/O current shift:  07/27 0700 - 07/27 1859  In: 512.35 [I.V.:512.35]  Out: 155 [Urine:155]    Drips:   Current Facility-Administered Medications   Medication Dose Frequency Last Rate   . dexmedetomidine (PRECEDEX) 400 mcg in NS 181m premix infusion  0.2 mcg/kg/hr Continuous 0.2 mcg/kg/hr (11/23/2018 1209)   . electrolyte-A (PLASMALYTE-A) premix infusion   Continuous 100 mL/hr at 12/01/2018 0752   . insulin regular human 250 Units in NS 250 mL infusion  3 Units/hr Continuous 4 Units/hr (12/14/2018 1208)              Drains:   Oral Gastric tube  Foley catheter    Lines (Type and Location)  Date Placed  Date Changed  Necessity Review   1.Peripheral IV right, antecubital fossa 7/27     2.Peripheral IV left forearm 7/27     3.Peripheral IV; lower forearm 7/27     4.      5.            Prophylaxis:  Date Started Date Completed   DVT/PE : Heparin 7/27    GI: Famotidine 7/27      Sedation/ Analgesia/ Paralysis: (SAS score and Drug Free Holiday)         Antibiotics: Date Started Date Completed   1.UNASYN 7/27    2.     3.     4.       Nutrition/Residuals:  MNT PROTOCOL FOR DIETITIAN  DIET NPO - NOW    Radiology Results: I have reviewed imaging.        Assessment/ Plan:  Active Hospital Problems    Diagnosis   . Cardiac arrest (CMS [place]   . Hypokalemia     Mr. Jon Jon Parrish a 38year old male with a past medical history of IV drug use presenting with cardiac arrest, likely secondary to opioid intoxication    1.Cardiac arrest/ Hypoxic Hypercapnic respiratory Failure/ Opioid Intoxication/ Hypoxic Brain injury  Patient found by EMS pulseless. Total time spent hypoxic unknown. Underwent ALS for round 15 minutes.   --S/P Narcan,  Epinephrine x2, two defillibrating shocks and amiodarone bolus  --On Continuous ECG and EEG  --Cooling protocol initiated    --BMP Q4    2. Drug abuse  Found with needle in arm, s/p Narcan   Urine Drug screen positive for Fentanyl, Amphetamine, Buprenorphine and BAL of 0.99    3. Leukocytosis  WBC count of 24k, likely secondary to acute stress from Cardiac arrest and ALS    4. Lactic acidosis  Level of 8.7, likely secondary to stress from Cardiac arrest and ALS    5. Hypophosphatemia/ Hypomagnesemia/ Hypokalemia  Likely secondary to cardiac arrest, possible secondary effect of cooling protocol  --replace    6. AKI  Creatinine 1.75, likely secondary to cardiac arrest and ALS  --Plasmalyte 100 mL/hour  --Trend BMP    7.Hypertropeinemia    --Likely  secondary to cardiac arrest/ CPR  trend X3    8. Hyperglycemia   Last  POC blood sugar 264. Numbers were hovering around 500 for most of the morning. Unknown if patient has history of Diabetes  --Insulin drip          DVT/PE: Heparin    Tish Frederickson, MED STUDENT IV    I saw and examined the patient with the resident. I have reviewed Dr.Couso's note. I agree with the findings and plan of care as documented in the  note. Any exceptions/additions are noted above.    Oley Balm ,MD  Assistant Professor,Section of Pulmonary ,Critical Care and Sleep Medicine  Department of Medicine, [facility]

## 2018-12-16 ENCOUNTER — Inpatient Hospital Stay (HOSPITAL_BASED_OUTPATIENT_CLINIC_OR_DEPARTMENT_OTHER): Payer: Self-pay

## 2018-12-16 ENCOUNTER — Inpatient Hospital Stay (HOSPITAL_COMMUNITY): Payer: Self-pay

## 2018-12-16 DIAGNOSIS — Z4682 Encounter for fitting and adjustment of non-vascular catheter: Secondary | ICD-10-CM

## 2018-12-16 DIAGNOSIS — G9340 Encephalopathy, unspecified: Secondary | ICD-10-CM

## 2018-12-16 LAB — BASIC METABOLIC PANEL
ANION GAP: 16 mmol/L — ABNORMAL HIGH (ref 4–13)
ANION GAP: 15 mmol/L — ABNORMAL HIGH (ref 4–13)
ANION GAP: 17 mmol/L — ABNORMAL HIGH (ref 4–13)
ANION GAP: 18 mmol/L — ABNORMAL HIGH (ref 4–13)
BUN/CREA RATIO: 29 — ABNORMAL HIGH (ref 6–22)
BUN/CREA RATIO: 37 — ABNORMAL HIGH (ref 6–22)
BUN/CREA RATIO: 35 — ABNORMAL HIGH (ref 6–22)
BUN/CREA RATIO: 35 — ABNORMAL HIGH (ref 6–22)
BUN: 30 mg/dL — ABNORMAL HIGH (ref 8–25)
BUN: 38 mg/dL — ABNORMAL HIGH (ref 8–25)
BUN: 37 mg/dL — ABNORMAL HIGH (ref 8–25)
BUN: 36 mg/dL — ABNORMAL HIGH (ref 8–25)
CALCIUM: 7.2 mg/dL — ABNORMAL LOW (ref 8.5–10.2)
CALCIUM: 9 mg/dL (ref 8.5–10.2)
CALCIUM: 9.6 mg/dL (ref 8.5–10.2)
CALCIUM: 9.3 mg/dL (ref 8.5–10.2)
CHLORIDE: 108 mmol/L (ref 96–111)
CHLORIDE: 109 mmol/L (ref 96–111)
CHLORIDE: 106 mmol/L (ref 96–111)
CHLORIDE: 102 mmol/L (ref 96–111)
CO2 TOTAL: 15 mmol/L — ABNORMAL LOW (ref 22–32)
CO2 TOTAL: 19 mmol/L — ABNORMAL LOW (ref 22–32)
CO2 TOTAL: 18 mmol/L — ABNORMAL LOW (ref 22–32)
CO2 TOTAL: 21 mmol/L — ABNORMAL LOW (ref 22–32)
CREATININE: 1.03 mg/dL (ref 0.62–1.27)
CREATININE: 1.02 mg/dL (ref 0.62–1.27)
CREATININE: 1.05 mg/dL (ref 0.62–1.27)
CREATININE: 1.04 mg/dL (ref 0.62–1.27)
ESTIMATED GFR: >60 mL/min/{1.73_m2} (ref >60)
ESTIMATED GFR: >60 mL/min/{1.73_m2} (ref >60)
ESTIMATED GFR: >60 mL/min/{1.73_m2} (ref >60)
ESTIMATED GFR: >60 mL/min/{1.73_m2} (ref >60)
GLUCOSE: 149 mg/dL — ABNORMAL HIGH (ref 65–139)
GLUCOSE: 120 mg/dL (ref 65–139)
GLUCOSE: 132 mg/dL (ref 65–139)
GLUCOSE: 180 mg/dL — ABNORMAL HIGH (ref 65–139)
POTASSIUM: 4.7 mmol/L (ref 3.5–5.1)
POTASSIUM: 3.2 mmol/L — ABNORMAL LOW (ref 3.5–5.1)
POTASSIUM: 3.7 mmol/L (ref 3.5–5.1)
POTASSIUM: 3.2 mmol/L — ABNORMAL LOW (ref 3.5–5.1)
SODIUM: 139 mmol/L (ref 136–145)
SODIUM: 143 mmol/L (ref 136–145)
SODIUM: 141 mmol/L (ref 136–145)
SODIUM: 141 mmol/L (ref 136–145)

## 2018-12-16 LAB — POC BLOOD GLUCOSE (RESULTS)
GLUCOSE, POC: 165 mg/dL — ABNORMAL HIGH (ref 70–105)
GLUCOSE, POC: 160 mg/dL — ABNORMAL HIGH (ref 70–105)
GLUCOSE, POC: 122 mg/dL — ABNORMAL HIGH (ref 70–105)
GLUCOSE, POC: 129 mg/dL — ABNORMAL HIGH (ref 70–105)
GLUCOSE, POC: 173 mg/dL — ABNORMAL HIGH (ref 70–105)
GLUCOSE, POC: 141 mg/dL — ABNORMAL HIGH (ref 70–105)

## 2018-12-16 LAB — TROPONIN-I
TROPONIN I: 7921 ng/L (ref 0–30)
TROPONIN I: 7480 ng/L (ref 0–30)
TROPONIN I: 7013 ng/L (ref 0–30)

## 2018-12-16 LAB — VENOUS BLOOD GAS/LACTATE
%FIO2 (VENOUS): 30 %
%FIO2 (VENOUS): 30 %
%FIO2 (VENOUS): 30 %
BASE DEFICIT: 7.4 mmol/L — ABNORMAL HIGH (ref ?–3.0)
BASE EXCESS: 0 mmol/L (ref ?–3.0)
BASE EXCESS: 1.3 mmol/L (ref ?–3.0)
BICARBONATE (VENOUS): 18.4 mmol/L — ABNORMAL LOW (ref 22.0–26.0)
BICARBONATE (VENOUS): 23.8 mmol/L (ref 22.0–26.0)
BICARBONATE (VENOUS): 25.1 mmol/L (ref 22.0–26.0)
LACTATE: 3.3 mmol/L — ABNORMAL HIGH (ref 0.0–1.3)
LACTATE: 4.1 mmol/L — ABNORMAL HIGH (ref 0.0–1.3)
LACTATE: 5 mmol/L — ABNORMAL HIGH (ref 0.0–1.3)
PCO2 (VENOUS): 32 mmHg — ABNORMAL LOW (ref 41.00–51.00)
PCO2 (VENOUS): 40 mmHg — ABNORMAL LOW (ref 41.00–51.00)
PCO2 (VENOUS): 36 mmHg — ABNORMAL LOW (ref 41.00–51.00)
PH (VENOUS): 7.34 (ref 7.31–7.41)
PH (VENOUS): 7.4 (ref 7.31–7.41)
PH (VENOUS): 7.45 — ABNORMAL HIGH (ref 7.31–7.41)
PO2 (VENOUS): 38 mmHg (ref 35.0–50.0)
PO2 (VENOUS): 30 mmHg — ABNORMAL LOW (ref 35.0–50.0)
PO2 (VENOUS): 33 mmHg — ABNORMAL LOW (ref 35.0–50.0)

## 2018-12-16 LAB — SUBOXONE CONFIRMATORY/DEFINITIVE, URINE, BY LC-MS/MS (PERFORMABLE)
BUPRENORPHINE: 9 ng/mL — ABNORMAL HIGH (ref <5)
NALOXONE: >1000 ng/mL — ABNORMAL HIGH (ref <5)
NORBUPRENORPHINE: 77 ng/mL — ABNORMAL HIGH (ref <5)

## 2018-12-16 LAB — CBC WITH DIFF
BASOPHIL #: <0.1 10*3/uL (ref <=0.20)
BASOPHIL %: 0 %
EOSINOPHIL #: <0.1 10*3/uL (ref <=0.50)
EOSINOPHIL %: 0 %
HCT: 43.8 % (ref 38.9–52.0)
HGB: 15.1 g/dL (ref 13.4–17.5)
IMMATURE GRANULOCYTE #: <0.1 10*3/uL (ref <0.10)
IMMATURE GRANULOCYTE %: 0 % (ref 0–1)
LYMPHOCYTE #: 1 10*3/uL (ref 1.00–4.80)
LYMPHOCYTE %: 8 %
MCH: 30.7 pg (ref 26.0–32.0)
MCHC: 34.5 g/dL (ref 31.0–35.5)
MCV: 89 fL (ref 78.0–100.0)
MONOCYTE #: 1.07 10*3/uL (ref 0.20–1.10)
MONOCYTE %: 9 %
MPV: 10.4 fL (ref 8.7–12.5)
NEUTROPHIL #: 9.72 10*3/uL — ABNORMAL HIGH (ref 1.50–7.70)
NEUTROPHIL %: 83 %
PLATELETS: 179 10*3/uL (ref 150–400)
RBC: 4.92 10*6/uL (ref 4.50–6.10)
RDW-CV: 13.5 % (ref 11.5–15.5)
WBC: 11.8 10*3/uL — ABNORMAL HIGH (ref 3.7–11.0)

## 2018-12-16 LAB — FENTANYL CONFIRMATORY/DEFINITIVE, URINE, BY LC-MS/MS (PERFORMABLE)
FENTANYL: >100 ng/mL — ABNORMAL HIGH (ref <0.2)
NORFENTANYL: 58 ng/mL — ABNORMAL HIGH (ref <2)

## 2018-12-16 LAB — CREATINE KINASE (CK), TOTAL, SERUM OR PLASMA
CREATINE KINASE: 955 U/L — ABNORMAL HIGH (ref 45–225)
CREATINE KINASE: 1552 U/L — ABNORMAL HIGH (ref 45–225)
CREATINE KINASE: 1707 U/L — ABNORMAL HIGH (ref 45–225)

## 2018-12-16 MED ORDER — FENTANYL (PF) 50 MCG/ML INJECTION SOLUTION
100.00 ug | INTRAMUSCULAR | Status: DC | PRN
Start: 2018-12-16 — End: 2018-12-22
  Administered 2018-12-16 – 2018-12-18 (×6): 100 ug via INTRAVENOUS
  Filled 2018-12-16 (×6): qty 2

## 2018-12-16 MED ORDER — AMLODIPINE 5 MG TABLET
5.0000 mg | ORAL_TABLET | Freq: Every day | ORAL | Status: DC
Start: 2018-12-16 — End: 2018-12-22
  Administered 2018-12-16 – 2018-12-22 (×7): 5 mg via GASTROSTOMY
  Filled 2018-12-16 (×7): qty 1

## 2018-12-16 MED ORDER — POTASSIUM BICARBONATE-CITRIC ACID 20 MEQ EFFERVESCENT TABLET
40.0000 meq | EFFERVESCENT_TABLET | ORAL | Status: AC
Start: 2018-12-16 — End: 2018-12-16
  Administered 2018-12-16: 40 meq via GASTROSTOMY
  Filled 2018-12-16: qty 2

## 2018-12-16 MED ORDER — CALCIUM GLUCONATE 100 MG/ML (10 %) INTRAVENOUS SOLUTION
1000.0000 mg | Freq: Once | INTRAVENOUS | Status: AC
Start: 2018-12-16 — End: 2018-12-16
  Administered 2018-12-16: 1000 mg via INTRAVENOUS
  Filled 2018-12-16: qty 10

## 2018-12-16 MED ORDER — DOCUSATE SODIUM 50 MG/5 ML ORAL LIQUID
100.00 mg | Freq: Two times a day (BID) | ORAL | Status: DC
Start: 2018-12-16 — End: 2018-12-22
  Administered 2018-12-16: 0 mg via GASTROSTOMY
  Administered 2018-12-17 – 2018-12-22 (×11): 100 mg via GASTROSTOMY
  Filled 2018-12-16 (×11): qty 10

## 2018-12-16 MED ORDER — POTASSIUM CHLORIDE 10 MEQ/100ML IN STERILE WATER INTRAVENOUS PIGGYBACK
10.0000 meq | INJECTION | INTRAVENOUS | Status: AC
Start: 2018-12-16 — End: 2018-12-17
  Administered 2018-12-16: 10 meq via INTRAVENOUS
  Administered 2018-12-16: 23:00:00 0 meq via INTRAVENOUS
  Administered 2018-12-17 (×3): 10 meq via INTRAVENOUS
  Administered 2018-12-17 (×3): 0 meq via INTRAVENOUS
  Filled 2018-12-16 (×4): qty 100

## 2018-12-16 MED ORDER — GADOBUTROL 10 MMOL/10 ML (1 MMOL/ML) INTRAVENOUS SOLUTION
8.00 mL | INTRAVENOUS | Status: AC
Start: 2018-12-16 — End: 2018-12-16
  Administered 2018-12-16: 22:00:00 8 mL via INTRAVENOUS

## 2018-12-16 MED ORDER — LACTULOSE 20 GRAM/30 ML ORAL SOLUTION
30.00 mL | Freq: Two times a day (BID) | ORAL | Status: DC
Start: 2018-12-16 — End: 2018-12-22
  Administered 2018-12-16 – 2018-12-19 (×7): 30 mL via GASTROSTOMY
  Administered 2018-12-20: 0 mL via GASTROSTOMY
  Administered 2018-12-20: 30 mL via GASTROSTOMY
  Administered 2018-12-21: 0 mL via GASTROSTOMY
  Administered 2018-12-21 – 2018-12-22 (×2): 30 mL via GASTROSTOMY
  Filled 2018-12-16 (×10): qty 30

## 2018-12-16 MED ORDER — POTASSIUM CHLORIDE 20 MEQ/50 ML IN STERILE WATER INTRAVENOUS PIGGYBACK
20.0000 meq | INJECTION | INTRAVENOUS | Status: DC
Start: 2018-12-16 — End: 2018-12-16

## 2018-12-16 MED ORDER — HYDRALAZINE 20 MG/ML INJECTION SOLUTION
10.0000 mg | Freq: Four times a day (QID) | INTRAMUSCULAR | Status: DC | PRN
Start: 2018-12-16 — End: 2018-12-18
  Administered 2018-12-16 – 2018-12-18 (×5): 10 mg via INTRAVENOUS
  Filled 2018-12-16 (×6): qty 1

## 2018-12-16 NOTE — Progress Notes (Signed)
[facility]                                                     MICU PROGRESS NOTE    Jon Parrish, Jon Jr., 38 y.o. male  Date of Admission:  05-Jun-2018  Date of service: 12/16/2018  Date of Birth:  10/06/1980    Subjective: Overnight patient with hypertension 130/100 and Norvasc added, Precedex discontinued. GCS 1/1/1 and with no protective reflexes. No SBT attempted due to patient not following.    Vital Signs:  Temp (24hrs) Max:39.3 C (102.7 F)      Systolic (24hrs), Avg:134 , Min:105 , Max:180     Diastolic (24hrs), Avg:105, Min:86, Max:138    Temp  Avg: 35.8 C (96.5 F)  Min: 33.2 C (91.8 F)  Max: 39.3 C (102.7 F)  MAP (Non-Invasive)  Avg: 114.9 mmHG  Min: 95 mmHG  Max: 145 mmHG  Pulse  Avg: 78.6  Min: 56  Max: 115  Resp  Avg: 22  Min: 13  Max: 33  SpO2  Avg: 99.7 %  Min: 97 %  Max: 100 %       Hemodynamics:       Ventilator Settings: Conventional settings:  Set PEEP: 5 cmH2O  Pressure Support: 8 cmH2O  FiO2: 30 %    Blood Gas Results:  Recent Labs     2019-03-29  1231 2019-03-29  1609 12/16/18  0315   FI02 30.0 30.0 30.0   PH 7.29* 7.30* 7.34   PCO2 31.00* 43.00 32.00*   PO2 42.0 33.0* 38.0   BICARBONATE 16.1* 19.8* 18.4*   BASEDEFICIT 10.4* 5.1* 7.4*       Weaning Parameters:         Current Medications:  amLODIPine (NORVASC) tablet, 2.5 mg, Oral, Daily  ampicillin-sulbactam (UNASYN) 3 g in NS 100 mL IVPB, 3 g, Intravenous, Q6H  chlorhexidine gluconate (PERIDEX) 0.12% mouthwash, 15 mL, Swish & Spit, 2x/day  dexmedetomidine (PRECEDEX) 400 mcg in NS 100mL premix infusion, 0.2 mcg/kg/hr, Intravenous, Continuous  dextrose 50% in water 50% injection ---Cabinet Override, , ,   docusate sodium (COLACE) capsule, 100 mg, Oral, 2x/day  electrolyte-A (PLASMALYTE-A) premix infusion, , Intravenous, Continuous  famotidine (PEPCID) 40mg  per 5mL oral liquid, 20 mg, Gastric (NG, OG, PEG, GT), 2x/day  fentaNYL (SUBLIMAZE) 50 mcg/mL (tot vol 50 mL) infusion, 0.2  mcg/kg/hr (Ideal), Intravenous, Continuous  fentaNYL (SUBLIMAZE) 50 mcg/mL injection, 50 mcg, Intravenous, Q15 Min PRN  heparin 5,000 unit/mL injection, 5,000 Units, Subcutaneous, Q8HRS  hydrALAZINE (APRESOLINE) injection 10 mg, 10 mg, Intravenous, Q6H PRN  lactulose (ENULOSE) 20g per 30mL oral liquid, 30 mL, Oral, 2x/day  NS flush syringe, 2 mL, Intracatheter, Q8HRS    And  NS flush syringe, 2-6 mL, Intracatheter, Q1 MIN PRN  SSIP insulin lispro (HUMALOG) 100 units/mL injection, 0-12 Units, Subcutaneous, Q4H PRN        Today's Physical Exam:  Constitutional: acutely ill, no distress and intubated  Eyes: Conjunctiva clear., Pupils equal and round. , abnormal findings: pupils non-reactive and pinpoint  ENT: Mouth mucous membranes moist.   Neck: supple, symmetrical, trachea midline  Respiratory: Clear to auscultation bilaterally. , no wheezes. Intubated and mechanically ventilated.  Cardiovascular: tachycardia, regular rhythm, no murmur. No peripheral edema.  Gastrointestinal: Soft, non-tender, non-distended  Genitourinary: Deferred  Musculoskeletal: Head atraumatic and normocephalic.   Integumentary:  Skin warm and dry. Bilateral lower extremity excoriations. Needle track marks upper extremities.  Neurologic: GCS 3, intubated. No protective reflexes/cough/gag/corneal.  Psychiatric: Unable to assess given patient unresposive      Labs:  I have reviewed all lab results.  Improvement in leukocytosis from 24.3 to 11.8   Improvement of creatinine from 1.75 to 1.03  Improvement of lactate from 12.1 to 3.3  Troponin peak of [id number]; with current trend downwards  Increasing trend of CK (last 1552)      I/O:  I/O last 24 hours:      Intake/Output Summary (Last 24 hours) at 12/16/2018 0758  Last data filed at 12/16/2018 0700  Gross per 24 hour   Intake 2708.42 ml   Output 575 ml   Net 2133.42 ml     I/O current shift:  07/28 0700 - 07/28 1859  In: 235.4 [I.V.:235.4]  Out: 1145 [Urine:95; NG/OG/GT:1050]    Drips:   Current  Facility-Administered Medications   Medication Dose Frequency Last Rate   . dexmedetomidine (PRECEDEX) 400 mcg in NS 100mL premix infusion  0.2 mcg/kg/hr Continuous Stopped (12/16/18 0735)   . electrolyte-A (PLASMALYTE-A) premix infusion   Continuous 100 mL/hr at 2019/02/02 1826   . fentaNYL (SUBLIMAZE) 50 mcg/mL (tot vol 50 mL) infusion  0.2 mcg/kg/hr (Ideal) Continuous Stopped (12/16/18 0735)       Lines/Drains:   Patient Lines/Drains/Airways Status    Active Line / Dialysis Catheter / Dialysis Graft / Drain / Airway / Wound     Name: Placement date: Placement time: Site: Days:    Peripheral IV Right Median Cubital  (antecubital fossa)  --   --       Peripheral IV Left Forearm  2019/02/02   0637   1    Peripheral IV Right;Lower Forearm  2019/02/02   0646   1    Oral Gastric Tube Center  --   --       Foley Catheter  2019/02/02   [id number]   1    EndoTracheal Tube Oral 7.5 23 cm Lip  2019/02/02   0500   1              Respiratory Secretions: none reported    Prophylaxis:  Date Started Date Completed   DVT/PE  Heparin 07/12/18    GI: H2 blocker       Sedation/ Analgesia/ Paralysis: (SAS score and Drug Free Holiday)         Antibiotics: Date Started Date Completed   1. Ampicillin-sulbactam 07/12/18    2.     3.     4.       Nutrition/Residuals:  MNT PROTOCOL FOR DIETITIAN  DIET NPO - NOW    Radiology Results:    XR AP MOBILE CHEST performed on 12/16/2018 5:57 AM.  IMPRESSION:  Endotracheal and enteric tubes remain in place. There is an  esophageal temperature probe with tip at the level of the midesophagus.  Cardiac and mediastinal silhouettes are within normal limits. Lungs are  clear.    XR KUB performed on 07/12/2018 9:55 AM.  IMPRESSION:  Mild ileus type bowel gas pattern with large amount of retained fecal  material in the rectum.    CT BRAIN WO IV CONTRAST performed on 07/12/2018 7:44 AM.  IMPRESSION:  No acute intracranial process is identified. Given the patient's history  a short-term interval follow-up CT to evaluate for  developing hypoxic  injury could be considered.       Other Testing:  ECG 12-28-18: Sinus tachycardia 108 BMP, right atrial enlargement, prolonged QTc 514    TTE 12-28-2018:  Conclusions:  1. Technically difficult study due to limited acoustic windows.  2. Normal left ventricular size. The left ventricular ejection fraction could not be precisely assessed due to  poor endocardial border definition.  3. Resting Segmental Wall Motion Analysis: Total wall motion score is 2.00. The entire anterior wall is not  visualized. The entire inferior wall is not visualized. The apical cap is not visualized. The remaining left  ventricular segments demonstrate hypokinesis.      Microbiology:   none       Assessment/ Plan:  Active Hospital Problems    Diagnosis   . Cardiac arrest (CMS [place])   . Hypokalemia     Jon Lorene Samaan. is a 38 y.o. male with active substance use disorder admitted to the MICU after being found down with needle in his arm, post cardiac arrest with 15 mins of ACLS with ROSC reported as first asystole then vtach with 2 defibrillating shocks; UTox positive for meth, cannabinoids, buprenorphine, ad fentanyl.  Patient initially with hypertension requiring nicardipine drip, and hyperglycemia requiring insulin drip.    Cardiac arrest  Acute encephalopathy  Hypoxic hypercarbic respiratory failure  And post cardiac arrest related leukocytosis, electrolyte derangements  - Secondary to IVDU and overdose with Utox positive for multiple substances  - Continue post cardiac arrest care  - Continue therapeutic hypotermia for total 48 hours (plan to stop at 17:00 7/28)  - Continuous EEG  - CT brain imaging without acute findings of anoxic brain injury  - Plan for MRI brain this evening to further assess for developing anoxic brain injury in setting of GCS 3 without improvment in neurologic status  - Continue empiric antibiotic of Unasyn 3g Q6H  - Plan to begin trickle feeds after therapeutic hypothermia discontinued today  -  Recent TTE unremarkable but difficult read.  If patient goes into shock then consider repeat stat TTE.  - Trend VBG Q6H  - Given increasing trend of CK will repeat in morning  - Troponins trending downward  - Lactate trending downward  - Continue to trend electrolytes given post cardiac arrest and hypothermic cooling    Acute kidney injury  - Improvement in creatiie from 1.75 to 1.03  - Continue plasmalyte 100 mL/hr  - Monitor with daily BMP    Transaminitis  - AST 188, ALT 92  - Suspect secondary to alcohol use  ___________________________________      Prophylaxis:   DVT: Heparin   GI: H2 blocker   Analgesia: none   Nutrition: NPO will begin trickle feeds post therapeutic hypothermia   Therapy: OT and PT   Sedation: none      Jon Collin, DO  12/16/2018, 08:01  [facility], PGY-4   ------------------------------------------------------------------------------------------------------------------------------------------------  Critically ill young male with s/p cardiac arrest, multi substance abuse, encephalopathy with currently on TTM/therapeutic hypothermia protocol. Aggressive hemodynamic support. Serial neurological exam. MRI brain. Neuro-prognostication post rewarming. Empiric abx. Prognosis guarded.     I saw and examined the patient, personally performed the critical or key portions of the service and discussed patient management with the ICU team including the resident, fellow, nurse and ancillary staff. I reviewed the resident's note and agree with the history, findings and plan of care. Laboratory, hemodynamic, respiratory support, and radiological data were also reviewed and discussed. Exceptions are edited/noted above.    Critical Care time: 37 mins    Ishia Tenorio  Elinor ParkinsonG Jalon Blackwelder, MD  Section of Pulmonary, Critical Care and Sleep Medicine  Department of Medicine, [facility] [place]  12/16/2018, 16:21

## 2018-12-16 NOTE — Nurses Notes (Signed)
Arctic sun removed. Rewarming process started for goal temp 36-38C. Esophageal temp probe in place to closely monitor.

## 2018-12-16 NOTE — Nurses Notes (Addendum)
MICU 2 notified of lactate 5.0 (upward trend this shift). MD at bedside to assess patient. Next VBG due at 0100. Per MD, if lactate continues to be elevated, may consider CT abdomen. Will continue to monitor.

## 2018-12-16 NOTE — Nurses Notes (Signed)
Left message with HCS Deniel Mcquiston Sr [phone] to clear for MRI.

## 2018-12-16 NOTE — Student (Signed)
[facility]                                                     MICU PROGRESS NOTE    Jon Parrish, [place]., 38 y.o. male  Date of Admission:  12/03/2018  Date of service: 12/16/2018  Date of Birth:  November 11, 1980    Subjective: Jon Parrish is a 38 year old male, PMH of IV drug use, presenting as a transfer from outside facility for cardiac arrest and suspected opioid overdose. Patient was last seen 2-3 hours prior to being found by EMS pulseless, and apneic with a heroin needle in his arm. Patient received Narcan with no response. Patient then received Epinephrine x2 and went into ventricular fibrillation. Subsequently received two defibrillation shock and an amiodarone bolus. At outside facility patient's BP went to 231/136 for which he received nicardipine drip.    Overnight patient's BP increased to 180/123, and was placed on a norvasc drip. Patient is currently on therapeutic hypothermia pending re-evaluation for of brain function.      Vital Signs:  Temp (24hrs) Max:38.4 C (725.3 F)      Systolic (66YQI), HKV:425 , Min:105 , ZDG:387     Diastolic (56EPP), IRJ:188, Min:86, Max:138    Temp  Avg: 35.3 C (95.5 F)  Min: 33 C (91.4 F)  Max: 38.4 C (101.1 F)  MAP (Non-Invasive)  Avg: 115.7 mmHG  Min: 95 mmHG  Max: 145 mmHG  Pulse  Avg: 75.1  Min: 56  Max: 97  Resp  Avg: 23.4  Min: 13  Max: 33  SpO2  Avg: 99.8 %  Min: 98 %  Max: 100 %            Ventilator Settings: Conventional settings:  Set PEEP: 5 cmH2O  Pressure Support: 8 cmH2O  FiO2: 30 %    Blood Gas Results:  Recent Labs     11/28/2018  1231 11/22/2018  1609 12/16/18  0315   FI02 30.0 30.0 30.0   PH 7.29* 7.30* 7.34   PCO2 31.00* 43.00 32.00*   PO2 42.0 33.0* 38.0   BICARBONATE 16.1* 19.8* 18.4*   BASEDEFICIT 10.4* 5.1* 7.4*            Current Medications:  amLODIPine (NORVASC) tablet, 5 mg, Gastric (NG, OG, PEG, GT), Daily  ampicillin-sulbactam (UNASYN) 3 g in NS 100 mL IVPB, 3 g, Intravenous,  Q6H  chlorhexidine gluconate (PERIDEX) 0.12% mouthwash, 15 mL, Swish & Spit, 2x/day  dextrose 50% in water 50% injection ---Cabinet Override, , ,   docusate sodium (COLACE) 37m per mL oral liquid, 100 mg, Gastric (NG, OG, PEG, GT), 2x/day  electrolyte-A (PLASMALYTE-A) premix infusion, , Intravenous, Continuous  famotidine (PEPCID) 4665mper 65m34mral liquid, 20 mg, Gastric (NG, OG, PEG, GT), 2x/day  fentaNYL (SUBLIMAZE) 50 mcg/mL injection, 100 mcg, Intravenous, Q15 Min PRN  heparin 5,000 unit/mL injection, 5,000 Units, Subcutaneous, Q8HRS  hydrALAZINE (APRESOLINE) injection 10 mg, 10 mg, Intravenous, Q6H PRN  lactulose (ENULOSE) 20g per 8m54mal liquid, 30 mL, Gastric (NG, OG, PEG, GT), 2x/day  NS flush syringe, 2 mL, Intracatheter, Q8HRS    And  NS flush syringe, 2-6 mL, Intracatheter, Q1 MIN PRN  SSIP insulin lispro (HUMALOG) 100 units/mL injection, 0-12 Units, Subcutaneous, Q4H PRN        Today's Physical Exam:  General:  Intubated.  Eyes: non reactive.  Neck: trachea midline, no masses   Extremities: Bilateral lower extremity excoriations.  Neurologic: GCS 3, intubated  Neurological exam limited by therapeutic hypothermia  Pupils: Non reactive  Gaze: non-tracking  Cough reflex: absent  Gag reflex: absent  Deep Tendon reflexes: absent      Labs:  I have reviewed Labs.    I/O:  I/O last 24 hours:      Intake/Output Summary (Last 24 hours) at 12/16/2018 1159  Last data filed at 12/16/2018 1100  Gross per 24 hour   Intake 3076.15 ml   Output 1710 ml   Net 1366.15 ml     I/O current shift:  07/28 0700 - 07/28 1859  In: 1002.48 [P.O.:225; I.V.:777.48]  Out: 1292 [Urine:242; NG/OG/GT:1050]    Drips:   Current Facility-Administered Medications   Medication Dose Frequency Last Rate   . electrolyte-A (PLASMALYTE-A) premix infusion   Continuous 100 mL/hr at 12/16/18 1749       Drains:   Oral Gastric tube  Foley catheter    Lines (Type and Location)  Date Placed  Date Changed  Necessity Review   1.Peripheral IV right,  antecubital fossa 7/27     2.Peripheral IV left forearm 7/27     3.Peripheral IV; lower forearm 7/27     4.      5.            Prophylaxis:  Date Started Date Completed   DVT/PE : Heparin 7/27    GI: Famotidine 7/27      Sedation/ Analgesia/ Paralysis: (SAS score and Drug Free Holiday)         Antibiotics: Date Started Date Completed   1.UNASYN 7/27    2.     3.     4.       Nutrition/Residuals:  MNT PROTOCOL FOR DIETITIAN  DIET NPO - NOW  ADULT TUBE FEEDING - CONTINUOUS DRIP CONTINUOUS NO MEALS, TF ONLY; GLUCERNA 1.5; OG; Initial Rate (ml/hr): 10    Radiology Results: I have reviewed imaging.        Assessment/ Plan:  Active Hospital Problems    Diagnosis   . Cardiac arrest (CMS [place])   . Hypokalemia     Mr. Morawski is a 38 year old male with a past medical history of IV drug use presenting with cardiac arrest, likely secondary to opioid intoxication    1.Cardiac arrest/ Hypoxic Hypercapnic respiratory Failure/ Opioid Intoxication/ Hypoxic Brain injury  Patient found by EMS pulseless and apneic. Total time spent hypoxic unknown. Underwent ALS for 15 minutes.   --S/P Narcan,  Epinephrine x2, two defillibrating shocks and amiodarone bolus  --On Continuous ECG and EEG  --Cooling protocol started, pending re-evaluation    --BMP Q4    --On unasyn for prophylaxis   --Troponin and CK trended x3, overnight there was an increase in  troponin to 13,522. Numbers have down trended down to 7921. This spike is likely secondary to electic shocks during ALS, however will continue evaluating for sings cardiac abnormalities. Patient currently on DVT prophylactic levels of heparin    2. Electrolyte abnormalities  Likely secondary to cardiac arrest, possible secondary effect of cooling protocol  --replace    3. Hypertension,  Amlodipine 2.5 mg PO  --Standing order for hydralazine if SBP higher than 180 or DBP higher 110  Hydralazine     4. Drug abuse  Found with needle in arm, s/p Narcan   Urine Drug screen positive for Fentanyl,  Amphetamine,  Buprenorphine and BAL of 0.99    5. Leukocytosis  -resolved, likely secondary to stress from cardiac arrest    6. Lactic acidosis  Initially presented with a level of 8.7, likely secondary to stress from Cardiac arrest and ALS  --down-trending to 3.3    7. AKI  Creatinine 1.75 on admission, likely secondary to cardiac arrest and ALS  --resolved, continue trending BMP    8.Hypertropeinemia    --Likely secondary to cardiac arrest/ CPR  trend X3    9. Hyperglycemia   Last POC blood sugar 264. Numbers were hovering around 500 for most of the morning. Unknown if patient has history of Diabetes  --Insulin drip          DVT/PE: Heparin    Jon Parrish, MED STUDENT IV    ------------------------------------------------------------------------------------------------------------------------------------------------  I saw and examined the patient. I reviewed Jon Parrish-MSIV's note. Please refer to progress note for teaching addendum. Any exceptions/additions are noted above.       Jon Conch, MD  Section of Pulmonary, Critical Care and Sleep Medicine  Department of Medicine, [facility]  12/16/2018 13:35

## 2018-12-16 NOTE — Procedures (Signed)
NAMEDERMOT, GREMILLION   HOSPITAL NUMBER:  [id number]  DATE OF SERVICE:  12/01/2018  DOB:  01-01-1981  SEX:  M      Date of study:  December 15, 2018.  Start time:  11:21 a.m.  End time:  December 16, 2018, at 8 am.     EEG #:  [id number].  Technician:  Not marked.    REQUESTING PHYSICIAN:  Oley Balm, MD.    HISTORY:  The patient is a 38 year old male with a history of heroin overdose that led to cardiac arrest.  The EEG has been ordered for evaluation of interictal abnormalities.    REPORT:  This is a digitally acquired video EEG performed with the standard 10-20 montage of electrode placement.  Background is characterized by burst suppression.  Background is discontinuous and symmetric.  At the beginning of the study, there are periods of bursts that last approximately  10 seconds.  Part of the bursts are generalized  periodic discharges of 0.5 to 1 Hz. As the study progresses, there is less frequent periodic discharges that are noted. Hyperventilation and photic stimulation was not performed during the study.    CLINICAL SPELLS:  No clinical spells were marked.    ABNORMAL ACTIVITY:  The bursts were characterized with generalized periodic discharges lasting 0.5 to 1 Hz.    INTERPRETATION:  This is an abnormal inpatient continuous EEG.  No seizures were noted; however, there is burst suppression with generalized periodic discharges of 0.5 to 1 Hz signifying severe encephalopathy.         Judieth Keens, MD    Ilda Mori, MD  Assistant Professor   [facility]        CC:   Oley Balm, MD   [address]   [address]   [place], [place] [address]       DD:  12/16/2018 11:32:59  DT:  12/16/2018 17:36:05 JB  D#:  [id number]    I read this EEG and agree with the resident's report. Any exceptions have been edited by me.  Ilda Mori, MD

## 2018-12-16 NOTE — Nurses Notes (Signed)
[facility] radiology cleared head and chest of any metal. Set up with respiratory for MRI scan @ midnight on 12/17/18. Bedside nurse aware.

## 2018-12-16 NOTE — Care Plan (Signed)
Pt remains intubated and mechanically ventilated. Unable to perform SBT due to patient not following. No acute events occurred overnight.     VENTILATOR - CPAP(PS) / SPONTANEOUS CONTINUOUS Discontinue   Duration: Until Specified    Priority: Routine       Question Answer Comment   FIO2 (%) 30    Peep(cm/H2O) 5    Pressure Support(cm/H2O) 8    Indications IMPROVE DISTRIBUTION OF VENTILATION    Invasive/Non-Invasive INVASIVE
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
## 2018-12-16 NOTE — Care Plan (Signed)
Medical Nutrition Therapy Assessment        SUBJECTIVE : Pt intubated.     OBJECTIVE:     Current Diet Order/Nutrition Support:  MNT PROTOCOL FOR DIETITIAN  DIET NPO - NOW  ADULT TUBE FEEDING - CONTINUOUS DRIP CONTINUOUS NO MEALS, TF ONLY; GLUCERNA 1.5; OG; Initial Rate (ml/hr): 10       Height Used for Calculations: 190.5 cm (6\' 3" )  Weight Used For Calculations: 76.8 kg (169 lb 5 oz)(Prior to cooling blanket on bed )  BMI (kg/m2): 21.21  BMI Assessment: BMI 18.5-24.9: normal  Ideal Body Weight (IBW) (kg): 90.45  % Ideal Body Weight: 84.91           Estimated Needs:    Energy Calorie Requirements: 1950-2300 per day (25-30kcals/76.8 kg)  Protein Requirements (gms/day): 115-138 per day (1.5-1.8 g/76.8 kg)        Comments:3  38 y.o. White male with PMH significant for IV drug use presenting with drug overdose.   Pt presented with hyperglycemia requiring insulin drip that is discontinued.  Receiving SSI Protocol. Unknown if Pt has Hx of DM.   Pt currently on cooling protocol. To start rewarming at 5 PM.  Per team trickle feeds to start after 5 PM.   Pt possibly at risk for refeeding due to possibly having poor po intake based on admit weight and IVDU.       Plan/Interventions :   1. Starting Glucerna trickle feeds 10 ml/hr   2. Advance once completely rewarmed   3. Recommend goal feeds   . Tube Feed Formula : Glucerna 1.5   Goal Rate: 60 ml/hr   Provides: 2160 cal = 28 cals/kg                119 g protein = 1.5 g/kg                1092 ml free water from formula   4. Monitor electrolytes; supplement as needed  5. Monitor glucose; SSI Protocol as needed   6. Monitor weight trend           Nutrition Diagnosis: Inability to swallow related to Patient on vent as evidenced by Need for TF    Santiago Glad L. [place], [place], LD, [place]   12/16/2018, 12:04    Pager # [phone]

## 2018-12-16 NOTE — Ancillary Notes (Signed)
[facility]  MRI Technologist Note        MRI has been completed.        William Hamburger, RT (R)(MR) 12/16/2018, 21:37

## 2018-12-16 NOTE — Nurses Notes (Addendum)
MICU notified of pt BP 180/120 after giving PRN hydralazine and PRN fentanyl throughout the day and it not controlling the BP. Orders given to continue to PRN meds and monitor. Will continue to monitoring.

## 2018-12-16 NOTE — Care Plan (Signed)
Patient is on the following CPAP ventilator settings:      FIO2 (%) 30    Peep(cm/H2O) 5    Pressure Support(cm/H2O) 8        Will continue to monitor.

## 2018-12-17 ENCOUNTER — Other Ambulatory Visit (HOSPITAL_COMMUNITY): Payer: Self-pay

## 2018-12-17 ENCOUNTER — Inpatient Hospital Stay (HOSPITAL_COMMUNITY): Payer: Self-pay

## 2018-12-17 DIAGNOSIS — Z452 Encounter for adjustment and management of vascular access device: Secondary | ICD-10-CM

## 2018-12-17 DIAGNOSIS — Z8674 Personal history of sudden cardiac arrest: Secondary | ICD-10-CM

## 2018-12-17 DIAGNOSIS — Z4682 Encounter for fitting and adjustment of non-vascular catheter: Secondary | ICD-10-CM

## 2018-12-17 DIAGNOSIS — Z529 Donor of unspecified organ or tissue: Secondary | ICD-10-CM

## 2018-12-17 LAB — POC BLOOD GLUCOSE (RESULTS)
GLUCOSE, POC: 126 mg/dL — ABNORMAL HIGH (ref 70–105)
GLUCOSE, POC: 140 mg/dL — ABNORMAL HIGH (ref 70–105)
GLUCOSE, POC: 128 mg/dL — ABNORMAL HIGH (ref 70–105)
GLUCOSE, POC: 122 mg/dL — ABNORMAL HIGH (ref 70–105)

## 2018-12-17 LAB — BASIC METABOLIC PANEL
ANION GAP: 15 mmol/L — ABNORMAL HIGH (ref 4–13)
ANION GAP: 12 mmol/L (ref 4–13)
BUN/CREA RATIO: 34 — ABNORMAL HIGH (ref 6–22)
BUN/CREA RATIO: 32 — ABNORMAL HIGH (ref 6–22)
BUN: 33 mg/dL — ABNORMAL HIGH (ref 8–25)
BUN: 34 mg/dL — ABNORMAL HIGH (ref 8–25)
CALCIUM: 8.9 mg/dL (ref 8.5–10.2)
CALCIUM: 9.4 mg/dL (ref 8.5–10.2)
CHLORIDE: 104 mmol/L (ref 96–111)
CHLORIDE: 108 mmol/L (ref 96–111)
CO2 TOTAL: 23 mmol/L (ref 22–32)
CO2 TOTAL: 24 mmol/L (ref 22–32)
CREATININE: 0.96 mg/dL (ref 0.62–1.27)
CREATININE: 1.05 mg/dL (ref 0.62–1.27)
ESTIMATED GFR: >60 mL/min/{1.73_m2} (ref >60)
ESTIMATED GFR: >60 mL/min/{1.73_m2} (ref >60)
GLUCOSE: 136 mg/dL (ref 65–139)
GLUCOSE: 131 mg/dL (ref 65–139)
POTASSIUM: 3.6 mmol/L (ref 3.5–5.1)
POTASSIUM: 4.1 mmol/L (ref 3.5–5.1)
SODIUM: 142 mmol/L (ref 136–145)
SODIUM: 144 mmol/L (ref 136–145)

## 2018-12-17 LAB — GAMMA GT: GGT: 117 U/L — ABNORMAL HIGH (ref 7–50)

## 2018-12-17 LAB — CBC WITH DIFF
BASOPHIL #: <0.1 10*3/uL (ref <=0.20)
BASOPHIL %: 0 %
EOSINOPHIL #: <0.1 10*3/uL (ref <=0.50)
EOSINOPHIL %: 0 %
HCT: 47.3 % (ref 38.9–52.0)
HGB: 16.6 g/dL (ref 13.4–17.5)
IMMATURE GRANULOCYTE #: <0.1 10*3/uL (ref <0.10)
IMMATURE GRANULOCYTE %: 0 % (ref 0–1)
LYMPHOCYTE #: 0.6 10*3/uL — ABNORMAL LOW (ref 1.00–4.80)
LYMPHOCYTE %: 5 %
MCH: 30.4 pg (ref 26.0–32.0)
MCHC: 35.1 g/dL (ref 31.0–35.5)
MCV: 86.6 fL (ref 78.0–100.0)
MONOCYTE #: 1.14 10*3/uL — ABNORMAL HIGH (ref 0.20–1.10)
MONOCYTE %: 9 %
MPV: 10.4 fL (ref 8.7–12.5)
NEUTROPHIL #: 10.88 10*3/uL — ABNORMAL HIGH (ref 1.50–7.70)
NEUTROPHIL %: 86 %
PLATELETS: 223 10*3/uL (ref 150–400)
RBC: 5.46 10*6/uL (ref 4.50–6.10)
RDW-CV: 14 % (ref 11.5–15.5)
WBC: 12.7 10*3/uL — ABNORMAL HIGH (ref 3.7–11.0)

## 2018-12-17 LAB — CBC
HCT: 46.3 % (ref 38.9–52.0)
HGB: 16.1 g/dL (ref 13.4–17.5)
MCH: 30.8 pg (ref 26.0–32.0)
MCHC: 34.8 g/dL (ref 31.0–35.5)
MCV: 88.7 fL (ref 78.0–100.0)
MPV: 10.7 fL (ref 8.7–12.5)
PLATELETS: 201 10*3/uL (ref 150–400)
RBC: 5.22 10*6/uL (ref 4.50–6.10)
RDW-CV: 14.2 % (ref 11.5–15.5)
WBC: 14.4 10*3/uL — ABNORMAL HIGH (ref 3.7–11.0)

## 2018-12-17 LAB — VENOUS BLOOD GAS/LACTATE
%FIO2 (VENOUS): 30 %
%FIO2 (VENOUS): 40 %
BASE EXCESS: 3.1 mmol/L — ABNORMAL HIGH (ref ?–3.0)
BASE EXCESS: 5.3 mmol/L — ABNORMAL HIGH (ref ?–3.0)
BICARBONATE (VENOUS): 26.8 mmol/L — ABNORMAL HIGH (ref 22.0–26.0)
BICARBONATE (VENOUS): 28.3 mmol/L — ABNORMAL HIGH (ref 22.0–26.0)
LACTATE: 5.5 mmol/L — ABNORMAL HIGH (ref 0.0–1.3)
LACTATE: 4.1 mmol/L — ABNORMAL HIGH (ref 0.0–1.3)
PCO2 (VENOUS): 38 mmHg — ABNORMAL LOW (ref 41.00–51.00)
PCO2 (VENOUS): 46 mmHg (ref 41.00–51.00)
PH (VENOUS): 7.46 — ABNORMAL HIGH (ref 7.31–7.41)
PH (VENOUS): 7.43 — ABNORMAL HIGH (ref 7.31–7.41)
PO2 (VENOUS): 40 mmHg (ref 35.0–50.0)
PO2 (VENOUS): 34 mmHg — ABNORMAL LOW (ref 35.0–50.0)

## 2018-12-17 LAB — PT/INR
INR: 1.27 — ABNORMAL HIGH (ref 0.80–1.20)
PROTHROMBIN TIME: 14.7 s — ABNORMAL HIGH (ref 9.1–13.9)

## 2018-12-17 LAB — HEPATIC FUNCTION PANEL
ALBUMIN: 3.1 g/dL — ABNORMAL LOW (ref 3.5–5.0)
ALKALINE PHOSPHATASE: 75 U/L (ref 45–115)
ALT (SGPT): 135 U/L — ABNORMAL HIGH (ref <55)
AST (SGOT): 219 U/L — ABNORMAL HIGH (ref 8–48)
BILIRUBIN DIRECT: 0.2 mg/dL (ref <0.3)
BILIRUBIN TOTAL: 0.6 mg/dL (ref 0.3–1.3)
PROTEIN TOTAL: 6.9 g/dL (ref 6.4–8.3)

## 2018-12-17 LAB — ARTERIAL BLOOD GAS - MLS OP ONLY
BASE EXCESS (ARTERIAL): 5.9 mmol/L — ABNORMAL HIGH (ref 0.0–1.0)
BICARBONATE (ARTERIAL): 29.4 mmol/L — ABNORMAL HIGH (ref 18.0–26.0)
PCO2 (ARTERIAL): 32 mmHg — ABNORMAL LOW (ref 35.0–45.0)
PH (ARTERIAL): 7.55 — ABNORMAL HIGH (ref 7.35–7.45)
PO2 (ARTERIAL): 60 mmHg — ABNORMAL LOW (ref 72.0–100.0)

## 2018-12-17 LAB — PTT (PARTIAL THROMBOPLASTIN TIME): APTT: 31.7 s (ref 24.2–37.5)

## 2018-12-17 LAB — MAGNESIUM
MAGNESIUM: 1.7 mg/dL (ref 1.6–2.6)
MAGNESIUM: 1.9 mg/dL (ref 1.6–2.6)

## 2018-12-17 LAB — TYPE AND SCREEN
ABO/RH(D): O NEG
ANTIBODY SCREEN: NEGATIVE

## 2018-12-17 LAB — ARTERIAL BLOOD GAS
%FIO2 (ARTERIAL): 40 %
PAO2/FIO2 RATIO: 150 (ref <=200)

## 2018-12-17 LAB — PHOSPHORUS
PHOSPHORUS: 2.7 mg/dL (ref 2.4–4.7)
PHOSPHORUS: 2.7 mg/dL (ref 2.4–4.7)

## 2018-12-17 LAB — LIPASE: LIPASE: 109 U/L — ABNORMAL HIGH (ref 10–80)

## 2018-12-17 LAB — AMYLASE: AMYLASE: 66 U/L (ref 25–125)

## 2018-12-17 LAB — CREATINE KINASE (CK), TOTAL, SERUM OR PLASMA: CREATINE KINASE: 869 U/L — ABNORMAL HIGH (ref 45–225)

## 2018-12-17 LAB — LDH: LDH: 694 U/L — ABNORMAL HIGH (ref 125–220)

## 2018-12-17 LAB — IONIZED CALCIUM WITH PH
IONIZED CALCIUM: 1.17 mmol/L (ref 1.10–1.35)
PH (VENOUS): 7.55 (ref 7.31–7.41)

## 2018-12-17 MED ORDER — METHYLPREDNISOLONE SOD SUCCINATE 40 MG/ML SOLUTION FOR INJ. WRAPPER
1000.0000 mg | Freq: Two times a day (BID) | INTRAMUSCULAR | Status: DC
Start: 2018-12-17 — End: 2018-12-22
  Administered 2018-12-17: 0 mg via INTRAVENOUS
  Administered 2018-12-17: 1000 mg via INTRAVENOUS
  Administered 2018-12-18: 09:00:00 0 mg via INTRAVENOUS
  Administered 2018-12-18: 1000 mg via INTRAVENOUS
  Administered 2018-12-19: 0 mg via INTRAVENOUS
  Administered 2018-12-19 (×2): 1000 mg via INTRAVENOUS
  Administered 2018-12-19: 0 mg via INTRAVENOUS
  Administered 2018-12-19: 1000 mg via INTRAVENOUS
  Administered 2018-12-19: 0 mg via INTRAVENOUS
  Administered 2018-12-20: 1000 mg via INTRAVENOUS
  Administered 2018-12-20: 0 mg via INTRAVENOUS
  Administered 2018-12-20: 1000 mg via INTRAVENOUS
  Administered 2018-12-20 – 2018-12-21 (×2): 0 mg via INTRAVENOUS
  Administered 2018-12-21: 1000 mg via INTRAVENOUS
  Administered 2018-12-21: 0 mg via INTRAVENOUS
  Administered 2018-12-21: 1000 mg via INTRAVENOUS
  Administered 2018-12-22: 08:00:00
  Administered 2018-12-22: 1000 mg via INTRAVENOUS
  Filled 2018-12-17 (×12): qty 25

## 2018-12-17 MED ORDER — GLYCOPYRROLATE 100 MCG/ML ORAL LIQUID PEDS DILUTION
4.00 ug/kg | Freq: Three times a day (TID) | ORAL | Status: DC
Start: 2018-12-17 — End: 2018-12-22
  Administered 2018-12-17 – 2018-12-21 (×14): 320 ug via GASTROSTOMY
  Administered 2018-12-22: 13:00:00
  Administered 2018-12-22: 320 ug via GASTROSTOMY
  Filled 2018-12-17 (×18): qty 3.2

## 2018-12-17 MED ORDER — SODIUM CHLORIDE 0.9 % INTRAVENOUS PIGGYBACK
2.0000 g | INJECTION | Freq: Two times a day (BID) | INTRAVENOUS | Status: DC
Start: 2018-12-17 — End: 2018-12-22
  Administered 2018-12-17: 0 g via INTRAVENOUS
  Administered 2018-12-17: 20:00:00 2 g via INTRAVENOUS
  Administered 2018-12-18 (×2): 0 g via INTRAVENOUS
  Administered 2018-12-18 – 2018-12-19 (×3): 2 g via INTRAVENOUS
  Administered 2018-12-19: 0 g via INTRAVENOUS
  Administered 2018-12-19: 2 g via INTRAVENOUS
  Administered 2018-12-19: 0 g via INTRAVENOUS
  Administered 2018-12-20: 2 g via INTRAVENOUS
  Administered 2018-12-20 (×2): 0 g via INTRAVENOUS
  Administered 2018-12-20: 2 g via INTRAVENOUS
  Administered 2018-12-21: 0 g via INTRAVENOUS
  Administered 2018-12-21: 2 g via INTRAVENOUS
  Administered 2018-12-21: 0 g via INTRAVENOUS
  Administered 2018-12-21 – 2018-12-22 (×2): 2 g via INTRAVENOUS
  Filled 2018-12-17 (×9): qty 12.5
  Filled 2018-12-17: qty 100
  Filled 2018-12-17 (×12): qty 12.5

## 2018-12-17 MED ORDER — LACTATED RINGERS INTRAVENOUS SOLUTION
INTRAVENOUS | Status: DC
Start: 2018-12-17 — End: 2018-12-22
  Administered 2018-12-17 – 2018-12-22 (×4): via INTRAVENOUS

## 2018-12-17 MED ORDER — VANCOMYCIN 10 GRAM INTRAVENOUS SOLUTION
18.00 mg/kg | INTRAVENOUS | Status: DC
Start: 2018-12-17 — End: 2018-12-22
  Administered 2018-12-17: 0 mg via INTRAVENOUS
  Administered 2018-12-17: 1500 mg via INTRAVENOUS
  Administered 2018-12-18: 0 mg via INTRAVENOUS
  Administered 2018-12-18: 20:00:00 1500 mg via INTRAVENOUS
  Administered 2018-12-19: 0 mg via INTRAVENOUS
  Administered 2018-12-19: 1500 mg via INTRAVENOUS
  Administered 2018-12-20: 0 mg via INTRAVENOUS
  Administered 2018-12-20 – 2018-12-21 (×2): 1500 mg via INTRAVENOUS
  Administered 2018-12-21: 23:00:00 0 mg via INTRAVENOUS
  Filled 2018-12-17 (×7): qty 15

## 2018-12-17 MED ORDER — AMOXICILLIN 200 MG-POTASSIUM CLAVULANATE 28.5 MG/5 ML ORAL SUSPENSION
875.00 mg | INHALATION_SUSPENSION | Freq: Two times a day (BID) | ORAL | Status: DC
Start: 2018-12-17 — End: 2018-12-17
  Administered 2018-12-17: 875 mg via GASTROSTOMY
  Filled 2018-12-17 (×2): qty 21.9

## 2018-12-17 MED ORDER — ACETAMINOPHEN 325 MG TABLET
650.0000 mg | ORAL_TABLET | ORAL | Status: DC | PRN
Start: 2018-12-17 — End: 2018-12-22
  Administered 2018-12-17 – 2018-12-22 (×10): 650 mg via GASTROSTOMY
  Filled 2018-12-17 (×10): qty 2

## 2018-12-17 MED ORDER — PERFLUTREN LIPID MICROSPHERES 1.1 MG/ML INTRAVENOUS SUSPENSION
2.00 mL | INTRAVENOUS | Status: AC
Start: 2018-12-17 — End: 2018-12-17
  Administered 2018-12-17: 20:00:00 2 mL via INTRAVENOUS

## 2018-12-17 MED ORDER — VANCOMYCIN 1 GRAM/200 ML IN DEXTROSE 5 % INTRAVENOUS PIGGYBACK
1.0000 g | INJECTION | Freq: Two times a day (BID) | INTRAVENOUS | Status: DC
Start: 2018-12-17 — End: 2018-12-17

## 2018-12-17 NOTE — Care Plan (Signed)
VENTILATOR - CPAP(PS) / SPONTANEOUS CONTINUOUS    Discontinue   Duration: Until Specified    Priority: Routine       Question Answer Comment   FIO2 (%) 40    Peep(cm/H2O) 5    Pressure Support(cm/H2O) 8    Indications IMPROVE DISTRIBUTION OF VENTILATION    Invasive/Non-Invasive INVASIVE            Patient currently being mechanically ventilated on above settings.  CORE consulted for patient.  RT will continue to monitor patient.

## 2018-12-17 NOTE — Student (Signed)
[facility]                                                     MICU PROGRESS NOTE    Jon Furlongeets, Jon Jr., 38 y.o. male  Date of Admission:  11/27/2018  Date of service: 12/17/2018  Date of Birth:  01/30/1981    Subjective: Arctic sun discontinued yesterday. Patient spiked a fever of 102 overnight. GCS 1/1/1 and with no protective reflexes. Breathing is agonal, patient intubated, on vent. Per nurse patient has been producing heavy secretions.    Family was at bedside during morning rounds. Patient's poor prognosis was discussed, including the absence of protective reflexes and the new MRI Brain reading which showed findings of profound hypoxic ischemic encephalopathy. Patient was made No CPR.    Vital Signs:  Temp (24hrs) Max:39.1 C (102.4 F)      Systolic (24hrs), Avg:151 , Min:123 , Max:178     Diastolic (24hrs), Avg:108, Min:86, Max:138    Temp  Avg: 36.4 C (97.6 F)  Min: 33.3 C (91.9 F)  Max: 39.1 C (102.4 F)  MAP (Non-Invasive)  Avg: 120.6 mmHG  Min: 102 mmHG  Max: 149 mmHG  Pulse  Avg: 103.8  Min: 83  Max: 123  Resp  Avg: 26.3  Min: 17  Max: 36  SpO2  Avg: 95.1 %  Min: 90 %  Max: 100 %            Ventilator Settings: Conventional settings:  Set PEEP: 5 cmH2O  Pressure Support: 8 cmH2O  FiO2: 40 %    Blood Gas Results:  Recent Labs     12/16/18  1816 12/17/18  0127 12/17/18  0748   FI02 30.0 30.0 40.0   PH 7.45* 7.46* 7.43*   PCO2 36.00* 38.00* 46.00   PO2 33.0* 40.0 34.0*   BICARBONATE 25.1 26.8* 28.3*   BASEEXCESS 1.3 3.1* 5.3*              Current Medications:  acetaminophen (TYLENOL) tablet, 650 mg, Gastric (NG, OG, PEG, GT), Q4H PRN  amLODIPine (NORVASC) tablet, 5 mg, Gastric (NG, OG, PEG, GT), Daily  amoxicillin-clavulanate (AUGMENTIN) 200-28.5 mg per 5 mL oral liquid, 875 mg, Gastric (NG, OG, PEG, GT), 2x/day  chlorhexidine gluconate (PERIDEX) 0.12% mouthwash, 15 mL, Swish & Spit, 2x/day  docusate sodium (COLACE) 10mg  per mL oral liquid,  100 mg, Gastric (NG, OG, PEG, GT), 2x/day  famotidine (PEPCID) 40mg  per 5mL oral liquid, 20 mg, Gastric (NG, OG, PEG, GT), 2x/day  fentaNYL (SUBLIMAZE) 50 mcg/mL injection, 100 mcg, Intravenous, Q15 Min PRN  glycopyrrolate (ROBINUL) 100 mcg per mL oral liquid, 4 mcg/kg, Gastric (NG, OG, PEG, GT), Q8H  heparin 5,000 unit/mL injection, 5,000 Units, Subcutaneous, Q8HRS  hydrALAZINE (APRESOLINE) injection 10 mg, 10 mg, Intravenous, Q6H PRN  lactulose (ENULOSE) 20g per 30mL oral liquid, 30 mL, Gastric (NG, OG, PEG, GT), 2x/day  NS flush syringe, 2 mL, Intracatheter, Q8HRS    And  NS flush syringe, 2-6 mL, Intracatheter, Q1 MIN PRN  SSIP insulin lispro (HUMALOG) 100 units/mL injection, 0-12 Units, Subcutaneous, Q4H PRN        Today's Physical Exam:  Constitutional: acutely ill, no distress and intubated  Eyes: Conjunctiva clear., Pupils equal and round. , abnormal findings: pupils non-reactive and pinpoint  ENT: Mouth mucous membranes moist.   Neck:  supple, symmetrical, trachea midline  Respiratory: Clear to auscultation bilaterally. , no wheezes. Intubated and mechanically ventilated.  Cardiovascular: tachycardia, regular rhythm, no murmur. No peripheral edema.  Gastrointestinal: Soft, non-tender, non-distended  Genitourinary: Deferred  Musculoskeletal: Head atraumatic and normocephalic.   Integumentary:  Skin warm and dry. Bilateral lower extremity excoriations. Needle track marks upper extremities.  Psychiatric: Unable to assess given patient unresposive    Neurologic Exam:  Doll's eye reflex: Negative  Corneal reflex: absent  Gag reflex: absent  Cough reflex: absent  Caloric reflex test with cold water: No response  Pupillometer reaction: Left: 0 (No reaction)             Right: 0 (No reaction)    Labs:  I have reviewed all lab results.      I/O:  I/O last 24 hours:      Intake/Output Summary (Last 24 hours) at 12/17/2018 1259  Last data filed at 12/17/2018 1200  Gross per 24 hour   Intake 3425 ml   Output 954 ml   Net  2471 ml     I/O current shift:  07/29 0700 - 07/29 1859  In: 785 [P.O.:115; I.V.:610; OG:60]  Out: 335 [Urine:335]    Drips:   Current Facility-Administered Medications   Medication Dose Frequency Last Rate       Lines/Drains:   Patient Lines/Drains/Airways Status    Active Line / Dialysis Catheter / Dialysis Graft / Drain / Airway / Wound     Name: Placement date: Placement time: Site: Days:    Peripheral IV Right Median Cubital  (antecubital fossa)  --   --       Peripheral IV Left Forearm  2019/01/03   0637   2    Peripheral IV Right;Lower Forearm  2019/01/03   0646   2    Peripheral IV Ultrasound guided;Extended dwell catheter Left Forearm  12/16/18   1404   less than 1    Oral Gastric Tube Center  --   --       Foley Catheter  2019/01/03   [id number]   2    EndoTracheal Tube Oral 7.5 23 cm Lip  2019/01/03   0500   2              Respiratory Secretions: none reported    Prophylaxis:  Date Started Date Completed   DVT/PE  Heparin 10/19/2018    GI: H2 blocker       Sedation/ Analgesia/ Paralysis: (SAS score and Drug Free Holiday)         Antibiotics: Date Started Date Completed   1. Ampicillin-sulbactam 10/19/2018    2.     3.     4.       Nutrition/Residuals:  MNT PROTOCOL FOR DIETITIAN  DIET NPO - NOW  ADULT TUBE FEEDING - CONTINUOUS DRIP CONTINUOUS NO MEALS, TF ONLY; GLUCERNA 1.5; OG; Initial Rate (ml/hr): 10    Radiology Results:      MRI Brain W/WO contrast    FINDINGS: Diffusion weighted images demonstrate extensive degree of  cytotoxic edema that is noted to involve the superficial cortical gray in  addition to the deep gray matter, the bilateral hippocampi in addition to  the periaqueductal gray matter and the bilateral cerebral peduncles. There  is also involvement of the dorsal medulla and vermis of the cerebellum.  Imaging findings are compatible with extensive severe hypoxic ischemic  encephalopathy  Postcontrast images demonstrate no evidence of abnormal enhancement no  acute intracranial hemorrhage.  Patchy areas of  nonspecific hyperintense T2  white matter signal noted within the right frontal white matter.    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 Technically difficult study due to limited acoustic windows.  2. Normal left ventricular size. The left ventricular ejection fraction could not be precisely assessed due to  poor endocardial border definition.  3. Resting Segmental Wall Motion Analysis: Total wall motion score is 2.00. The entire anterior wall is not  visualized. The entire inferior wall is not visualized. The apical cap is not visualized. The remaining left  ventricular segments demonstrate hypokinesis.      Microbiology:   none       Assessment/ Plan:  Active Hospital Problems    Diagnosis   . Cardiac arrest (CMS [facility])   . Hypokalemia     Jon Mills Mitton. is a 38 y.o. male with active substance use disorder admitted to the MICU after being found down with needle in his arm, post cardiac arrest with 15 mins of ACLS with ROSC reported as first asystole then vtach with 2 defibrillating shocks; UTox positive for meth, cannabinoids, buprenorphine, ad fentanyl.  Patient initially with hypertension requiring nicardipine drip, and hyperglycemia requiring insulin drip.    Cardiac arrest  Acute encephalopathy  Hypoxic hypercarbic respiratory failure  And post cardiac arrest related leukocytosis, electrolyte derangements  - Secondary to IVDU and overdose with Utox positive for multiple substances  - Continue post cardiac arrest care  - S/P therapeutic hypotermia for total 48 hours ( stopped at 17:00 7/28)  - Brain MRI showed findings of extensive hypoxic ischemic encephalopathy.   findings were discussed with family who was at bedside during morning rounds. They  understood that patient's prognosis was poor. Patient was made NO CPR. Current plan of keeping patient on life support for 1 to 2 days so rest of family can visit patient.  - Continuous EEG  - Continue empiric antibiotic of Unasyn 3g Q6H  - NPO  - Recent TTE unremarkable but difficult read.  If patient goes into shock then consider repeat stat TTE.  - Trend VBG Q6H  - Continue to trend electrolytes given post cardiac arrest and hypothermic cooling    Fever, differential diagnosis includes post TH pyrexia v, aspiration pneumonia  -Tylenol PRN  -Hypo/Hyperthermia blanket    Heavy Secretions  -Robinul    Acute kidney injury, resolved  - Improvement in creatinine to 0.96  - Plasmalyte 100 mL/hr discontinued  - Monitor with daily BMP    Transaminitis  - AST 188, ALT 92  - Suspect secondary to alcohol use  ___________________________________      Prophylaxis:   DVT: Heparin   GI: H2 blocker   Analgesia: none   Nutrition: NPO   Sedation: none    Tish Frederickson, MED STUDENT Year IV    ------------------------------------------------------------------------------------------------------------------------------------------------  I saw and examined the patient. I reviewed MSIV's note. I agree with the findings and plan of care as documented in the MSIV's note. Any exceptions/additions are noted above. Please refer to separate progress note for teaching addendum.      Jon Conch, MD  Section of Pulmonary, Critical Care and Sleep Medicine  Department of Medicine, [facility]  12/17/2018 14:16

## 2018-12-17 NOTE — Procedures (Signed)
Internal Jugular Central Line Placement  Procedure Date:  12/17/2018 Time:  18:00  Procedure: Central Line Placement  Diagnosis:  Encephalopathy  Indication: venous access  Description: After informed consent a surgical time out was performed to confirm correct patient and procedure. The site was identified and prepped in the usual sterile fashion. The patient was placed in Trendelenburg position. The skin was prepped with chlorhexidine. The vein was located with ultrasound. Lidocaine analgesia was used. The central line needle was advanced into the vein. The guidewire was advanced thorough the needle. The needle was removed and the dilator advanced over the guidewire. A triple lumen central line was placed in the right internal jugular using the Seldinger technique and the guidewire was removed.  All ports drew blood back and flushed easily.  The line was sutured in place and dressed in sterile fashion. CXR ordered to verify placement. Patient tolerated procedure without complications.     Maxie Better, MD 12/17/2018, 18:25    ------------------------------------------------------------------------------------------------------------------------------------------------------------  I was not present but available for supervision/consultation.    Zachery Conch, MD  Section of Pulmonary, Critical Care and Sleep Medicine  Department of Medicine, [facility]  12/18/2018, 11:53

## 2018-12-17 NOTE — Nurses Notes (Signed)
12/17/18 0800   Pupils   Pupil PERRLA no   Pupil Size Left other (see comments)  (2.57 per pupilometer)   Pupil Shape Left round   Pupil Reaction Left sluggish   Pupil Accommodation Left absent   Pupil Size Right other (see comments)  (2.50 per pupilometer)   Pupil Shape Right round   Pupil Reaction Right fixed   Pupil Accommodation Right absent   Pupillometer   NPi Left 3.0 - 4.9 (Normal/"Brisk")   NPi Right 0 (Non-Reactive, Immeasurable, or Atypical Response)       Pupilometer results above.

## 2018-12-17 NOTE — Nurses Notes (Deleted)
12/17/18 0800   Pupils   Pupil PERRLA no   Pupil Size Left other (see comments)  (2.57 per pupilometer)   Pupil Shape Left round   Pupil Reaction Left sluggish   Pupil Accommodation Left absent   Pupil Size Right other (see comments)  (2.50 per pupilometer)   Pupil Shape Right round   Pupil Reaction Right fixed   Pupil Accommodation Right absent   Pupillometer   NPi Left 0 (Non-Reactive, Immeasurable, or Atypical Response)   NPi Right 3.0 - 4.9 (Normal /"Brisk")       Pupilometer results above.

## 2018-12-17 NOTE — Progress Notes (Signed)
[facility]                                                     MICU PROGRESS NOTE    Jon Parrish, Mcmillen., 38 y.o. male  Date of Admission:  12-18-18  Date of service: 12/17/2018  Date of Birth:  08-09-1980    Jon Mohl. is a 38 y.o. male with PMH s/f IVDU admitted after being found down in cardiac arrest with 15 minutes of CPR before ROSC likely 2/2 opioid overdose.      Subjective: Arctic sun discontinued yesterday. Patient spiked a fever of 102 overnight. GCS 1/1/1 and with no protective reflexes. Breathing is agonal, patient intubated, on vent. Per nurse patient has been producing heavy secretions.    Family was at bedside during morning rounds. Patient's poor prognosis was discussed, including the absence of protective reflexes and the new MRI Brain reading which showed findings of profound hypoxic ischemic encephalopathy. Patient was made No CPR.    Vital Signs:  Temp (24hrs) Max:39.1 C (382.5 F)      Systolic (05LZJ), QBH:419 , Min:123 , FXT:024     Diastolic (09BDZ), HGD:924, Min:86, Max:138    Temp  Avg: 36.6 C (97.9 F)  Min: 33.4 C (92.1 F)  Max: 39.1 C (102.4 F)  MAP (Non-Invasive)  Avg: 119.8 mmHG  Min: 102 mmHG  Max: 149 mmHG  Pulse  Avg: 103.6  Min: 83  Max: 123  Resp  Avg: 26.3  Min: 17  Max: 36  SpO2  Avg: 94.9 %  Min: 90 %  Max: 100 %            Ventilator Settings: Conventional settings:  Set PEEP: 5 cmH2O  Pressure Support: 8 cmH2O  FiO2: 40 %    Blood Gas Results:  Recent Labs     12/16/18  1816 12/17/18  0127 12/17/18  0748   FI02 30.0 30.0 40.0   PH 7.45* 7.46* 7.43*   PCO2 36.00* 38.00* 46.00   PO2 33.0* 40.0 34.0*   BICARBONATE 25.1 26.8* 28.3*   BASEEXCESS 1.3 3.1* 5.3*              Current Medications:  acetaminophen (TYLENOL) tablet, 650 mg, Gastric (NG, OG, PEG, GT), Q4H PRN  amLODIPine (NORVASC) tablet, 5 mg, Gastric (NG, OG, PEG, GT), Daily  amoxicillin-clavulanate (AUGMENTIN) 200-28.5 mg per 5 mL oral liquid, 875  mg, Gastric (NG, OG, PEG, GT), 2x/day  chlorhexidine gluconate (PERIDEX) 0.12% mouthwash, 15 mL, Swish & Spit, 2x/day  docusate sodium (COLACE) 10mg  per mL oral liquid, 100 mg, Gastric (NG, OG, PEG, GT), 2x/day  famotidine (PEPCID) 40mg  per 30mL oral liquid, 20 mg, Gastric (NG, OG, PEG, GT), 2x/day  fentaNYL (SUBLIMAZE) 50 mcg/mL injection, 100 mcg, Intravenous, Q15 Min PRN  glycopyrrolate (ROBINUL) 100 mcg per mL oral liquid, 4 mcg/kg, Gastric (NG, OG, PEG, GT), Q8H  heparin 5,000 unit/mL injection, 5,000 Units, Subcutaneous, Q8HRS  hydrALAZINE (APRESOLINE) injection 10 mg, 10 mg, Intravenous, Q6H PRN  lactulose (ENULOSE) 20g per 29mL oral liquid, 30 mL, Gastric (NG, OG, PEG, GT), 2x/day  NS flush syringe, 2 mL, Intracatheter, Q8HRS    And  NS flush syringe, 2-6 mL, Intracatheter, Q1 MIN PRN  SSIP insulin lispro (HUMALOG) 100 units/mL injection, 0-12 Units, Subcutaneous, Q4H PRN  Today's Physical Exam:  Constitutional: acutely ill, no distress and intubated  Eyes: Conjunctiva clear., Pupils equal and round. , abnormal findings: pupils non-reactive and pinpoint  ENT: Mouth mucous membranes moist.   Neck: supple, symmetrical, trachea midline  Respiratory: Clear to auscultation bilaterally. , no wheezes. Intubated and mechanically ventilated.  Cardiovascular: tachycardia, regular rhythm, no murmur. No peripheral edema.  Gastrointestinal: Soft, non-tender, non-distended  Genitourinary: Deferred  Musculoskeletal: Head atraumatic and normocephalic.   Integumentary:  Skin warm and dry. Bilateral lower extremity excoriations. Needle track marks upper extremities.  Psychiatric: Unable to assess given patient unresposive    Neurologic Exam:  Doll's eye reflex: Negative  Corneal reflex: absent  Gag reflex: absent  Cough reflex: absent  Caloric reflex test with cold water: No response  Pupillometer reaction: Left: 0 (No reaction)             Right: 0 (No reaction)    Labs:  I have reviewed all lab results.      I/O:  I/O  last 24 hours:      Intake/Output Summary (Last 24 hours) at 12/17/2018 1301  Last data filed at 12/17/2018 1200  Gross per 24 hour   Intake 3325 ml   Output 902 ml   Net 2423 ml     I/O current shift:  07/29 0700 - 07/29 1859  In: 785 [P.O.:115; I.V.:610; OG:60]  Out: 335 [Urine:335]    Drips:   Current Facility-Administered Medications   Medication Dose Frequency Last Rate       Lines/Drains:   Patient Lines/Drains/Airways Status    Active Line / Dialysis Catheter / Dialysis Graft / Drain / Airway / Wound     Name: Placement date: Placement time: Site: Days:    Peripheral IV Right Median Cubital  (antecubital fossa)  --   --       Peripheral IV Left Forearm  11/20/2018   0637   2    Peripheral IV Right;Lower Forearm  12/09/2018   0646   2    Peripheral IV Ultrasound guided;Extended dwell catheter Left Forearm  12/16/18   1404   less than 1    Oral Gastric Tube Center  --   --       Foley Catheter  12/14/2018   [id number]   2    EndoTracheal Tube Oral 7.5 23 cm Lip  12/12/2018   0500   2              Respiratory Secretions: none reported    Prophylaxis:  Date Started Date Completed   DVT/PE  Heparin 12/07/2018    GI: H2 blocker       Sedation/ Analgesia/ Paralysis: (SAS score and Drug Free Holiday)         Antibiotics: Date Started Date Completed   1. Ampicillin-sulbactam 11/20/2018    2.     3.     4.       Nutrition/Residuals:  MNT PROTOCOL FOR DIETITIAN  DIET NPO - NOW  ADULT TUBE FEEDING - CONTINUOUS DRIP CONTINUOUS NO MEALS, TF ONLY; GLUCERNA 1.5; OG; Initial Rate (ml/hr): 10    Radiology Results:      MRI Brain W/WO contrast    FINDINGS: Diffusion weighted images demonstrate extensive degree of  cytotoxic edema that is noted to involve the superficial cortical gray in  addition to the deep gray matter, the bilateral hippocampi in addition to  the periaqueductal gray matter and the bilateral cerebral peduncles. There  is also involvement  of the dorsal medulla and vermis of the cerebellum.  Imaging findings are compatible with  extensive severe hypoxic ischemic  encephalopathy  Postcontrast images demonstrate no evidence of abnormal enhancement no  acute intracranial hemorrhage. Patchy areas of nonspecific hyperintense T2  white matter signal noted within the right frontal white matter.    IMPRESSION:  Extensive profound hypoxic ischemic encephalopathy with cytotoxic edema  involving the superficial cortical and deep gray-white matter structures        Other Testing:   ECG 12/05/2018: Sinus tachycardia 108 BMP, right atrial enlargement, prolonged QTc 514    TTE 11/28/2018:  Conclusions:  1. Technically difficult study due to limited acoustic windows.  2. Normal left ventricular size. The left ventricular ejection fraction could not be precisely assessed due to  poor endocardial border definition.  3. Resting Segmental Wall Motion Analysis: Total wall motion score is 2.00. The entire anterior wall is not  visualized. The entire inferior wall is not visualized. The apical cap is not visualized. The remaining left  ventricular segments demonstrate hypokinesis.      Microbiology:   none       Assessment/ Plan:  Active Hospital Problems    Diagnosis   . Cardiac arrest (CMS HCC)   . Hypokalemia     Jon Phillips Grouteets Jr. is a 38 y.o. male with active substance use disorder admitted to the MICU after being found down with needle in his arm, post cardiac arrest with 15 mins of ACLS with ROSC reported as first asystole then vtach with 2 defibrillating shocks; UTox positive for meth, cannabinoids, buprenorphine, ad fentanyl.  Patient initially with hypertension requiring nicardipine drip, and hyperglycemia requiring insulin drip.    Cardiac arrest  Acute encephalopathy  Hypoxic hypercarbic respiratory failure  And post cardiac arrest related leukocytosis, electrolyte derangements  - Secondary to IVDU and overdose with Utox positive for multiple substances  - Continue post cardiac arrest care  - S/P therapeutic hypotermia for total 48 hours ( stopped at 17:00  7/28)  - Brain MRI showed findings of extensive hypoxic ischemic encephalopathy.   findings were discussed with family who was at bedside during morning rounds. They understood that patient's prognosis was poor. Patient was made NO CPR. Current plan of keeping patient on life support for 1 to 2 days so rest of family can visit patient.  - Continuous EEG  - Continue empiric antibiotic of Unasyn 3g Q6H  - tube feeds at 210mL/hr  - Recent TTE unremarkable but difficult read.  If patient goes into shock then consider repeat stat TTE.  - Trend VBG Q6H  - Continue to trend electrolytes given post cardiac arrest and hypothermic cooling    Fever, differential diagnosis includes post TH pyrexia v, aspiration pneumonia  -Tylenol PRN  -Hypo/Hyperthermia blanket    Heavy Secretions  -Robinul    Acute kidney injury, resolved  - Improvement in creatinine to 0.96  - Plasmalyte 100 mL/hr discontinued  - Monitor with daily BMP    Transaminitis  - AST 188, ALT 92  - Suspect secondary to alcohol use  ___________________________________      Prophylaxis:   DVT: Heparin   GI: H2 blocker   Analgesia: none   Nutrition: NPO   Sedation: none    Jon Boninerevor Aldred, MD, PGY-1  Jon Parrish, MED STUDENT Year IV  Written, reviewed, and edited by Jon Boninerevor Aldred, MD, PGY-1 with Jon Parrish.    ------------------------------------------------------------------------------------------------------------------------------------------------  Critically ill 38 yo M admitted s/p cardiac arrest in the setting  of polysubstance abuse. MRI brain with severe hypoxic insult. Clinical exam shows GCS 3, lack of brainstem reflexes (no cough/gag, pupillary light-direct and indirect, corneal, cold caloric, dolls eye movement) except initiation of breath on vent. Discussed the grimm prognosis with mother/father and likely to transition to CMO pending arrival of family members. Emotional support provided.     I saw and examined the patient, personally performed the  critical or key portions of the service and discussed patient management with the ICU team including the resident, fellow, nurse and ancillary staff. I reviewed the resident's note and agree with the history, findings and plan of care. Laboratory, hemodynamic, respiratory support, and radiological data were also reviewed and discussed. Exceptions are edited/noted above.    Critical Care time: 42 mins    Jon Fowers Elinor ParkinsonG Yamato Kopf, MD  Section of Pulmonary, Critical Care and Sleep Medicine  Department of Medicine, [facility]  12/17/2018, 13:52

## 2018-12-17 NOTE — Pharmacy (Signed)
[facility] / Department of Pharmaceutical Services  Therapeutic Drug Monitoring: Vancomycin  12/17/2018      Patient name: Jon Parrish, Jon Parrish.  Date of Birth:  [birth year]    Actual Weight:  Weight: 80.5 kg (177 lb 7.5 oz) (12/17/18 0400)     BMI:  BMI (Calculated): 22.23 (12/17/18 0400)    Date RPh Current regimen (including mg/kg) Indication Target Levels (mcg/mL) SCr (mg/dL) CrCl* (mL/min) Measured level (mcg/mL) Plan (including when levels are due) Comments   7/29 [facility] Initiation of therapy CORE N/a 1.05 approx 100  Vancomycin 1500 mg (18 mg/kg) q24h. Levels not indicated at this time                                                                              *Creatinine clearance is estimated by using the Cockcroft-Gault equation for adult patients and the Carol Ada for pediatric patients.    The decision to discontinue vancomycin therapy will be determined by the primary service.  Please contact the pharmacist with any questions regarding this patient's medication regimen.

## 2018-12-17 NOTE — Procedures (Signed)
Peripheral Arterial Catheter Insertion      Procedure Date:  12/17/2018  Time:  1820  Procedure: Peripheral Arterial Catheter Insertion  Diagnosis:  Encephalopathy  Indication:  Need for Vascular Access and Monitoring    Description: After informed consent a surgical time out was performed to confirm correct patient and procedure. The site was identified and prepped in the usual sterile fashion. An Arrow catheter was inserted into the left radial artery under sterile conditions.  Good blood return was noted, the catheter was advanced and secured in place.  There was minimal blood loss, no complications occurred and the patient tolerated the procedure well.      Melba Coon, DO 12/17/2018, 18:24     ------------------------------------------------------------------------------------------------------------------------------------------------------------  I was not present but available for supervision/consultation.    Zachery Conch, MD  Section of Pulmonary, Critical Care and Sleep Medicine  Department of Medicine, [facility]  12/18/2018, 11:52

## 2018-12-17 NOTE — Procedures (Signed)
NAMEANDERSSON, LARRABEE   HOSPITAL NUMBER:  [id number]  DATE OF SERVICE:  12/16/2018  DOB:  1980/05/29  SEX:  M      Date of study:  December 16, 2018.   EEG #:  [id number].   Technician:  Not marked.    REQUESTING PHYSICIAN:  Oley Balm, MD.    DATE AND TIME:  This dictation is for the portion of the study that starts on December 16, 2018, at 8:00 a.m. through December 17, 2018, at 10:30 a.m.    HISTORY:  This patient is a 38 year old male with a history of heroin overdose that led to cardiac arrest. The EEG has been ordered for evaluation of interictal abnormalities.    REPORT:  This is a digitally acquired video EEG performed with the standard 10-20 montage of electrode placement. The background becomes less discontinuous compared to the start of the vEEG and the background is symmetric. No posterior dominant rhythm is noted. During the start of this portion of the study, there is note of rolling eye movements. There is decreased burst suppression and increased theta and delta activity that is noted compared to start of the vEEG. As the study progress however, there is an increase in nonreactive suppression suggestive of worsening of moderate to severe encephalopathy. Hyperventilation and photic stimulation were not performed during this study.     CLINICAL SPELLS:  No clinical spells were marked.    ABNORMAL ACTIVITY:  There is a decrease in burst suppression pattern and increased diffuse theta and delta activity. As the study progresses, however, there is an increase in nonreactive suppression.     INTERPRETATION:  This is an abnormal inpatient continuous EEG.  No seizures were noted. There is decrease in burst suppression and increase in theta and delta frequencies suggestive of moderate-to-severe encephalopathy at the start of the study. However, as the study progresses, there is an increase in nonreactive suppression suggestive of worsening of moderate-to-severe encephalopathy. Clinical correlation is  recommended.        Judieth Keens, MD    Ilda Mori, MD  Assistant Professor   [facility] Department of Pediatrics and Neurology          DD:  12/17/2018 19:12:50  DT:  12/17/2018 20:36:08 FP  D#:  [id number]    I read this EEG and agree with the resident's report. Any exceptions have been edited by me.  Ilda Mori, MD

## 2018-12-18 ENCOUNTER — Inpatient Hospital Stay (HOSPITAL_COMMUNITY): Payer: Self-pay

## 2018-12-18 DIAGNOSIS — J982 Interstitial emphysema: Secondary | ICD-10-CM

## 2018-12-18 DIAGNOSIS — R739 Hyperglycemia, unspecified: Secondary | ICD-10-CM

## 2018-12-18 DIAGNOSIS — Z978 Presence of other specified devices: Secondary | ICD-10-CM

## 2018-12-18 DIAGNOSIS — Z95828 Presence of other vascular implants and grafts: Secondary | ICD-10-CM

## 2018-12-18 DIAGNOSIS — I351 Nonrheumatic aortic (valve) insufficiency: Secondary | ICD-10-CM

## 2018-12-18 DIAGNOSIS — I340 Nonrheumatic mitral (valve) insufficiency: Secondary | ICD-10-CM

## 2018-12-18 DIAGNOSIS — R509 Fever, unspecified: Secondary | ICD-10-CM

## 2018-12-18 DIAGNOSIS — Z9889 Other specified postprocedural states: Secondary | ICD-10-CM

## 2018-12-18 LAB — ARTERIAL BLOOD GAS - MLS OP ONLY
BASE EXCESS (ARTERIAL): 1.7 mmol/L — ABNORMAL HIGH (ref 0.0–1.0)
BASE EXCESS (ARTERIAL): 1.7 mmol/L — ABNORMAL HIGH (ref 0.0–1.0)
BASE EXCESS (ARTERIAL): 3.4 mmol/L — ABNORMAL HIGH (ref 0.0–1.0)
BICARBONATE (ARTERIAL): 26.1 mmol/L — ABNORMAL HIGH (ref 18.0–26.0)
BICARBONATE (ARTERIAL): 26.2 mmol/L — ABNORMAL HIGH (ref 18.0–26.0)
BICARBONATE (ARTERIAL): 27.6 mmol/L — ABNORMAL HIGH (ref 18.0–26.0)
PCO2 (ARTERIAL): 32 mmHg — ABNORMAL LOW (ref 35.0–45.0)
PCO2 (ARTERIAL): 38 mmHg (ref 35.0–45.0)
PCO2 (ARTERIAL): 44 mmHg (ref 35.0–45.0)
PH (ARTERIAL): 7.42 (ref 7.35–7.45)
PH (ARTERIAL): 7.44 (ref 7.35–7.45)
PH (ARTERIAL): 7.49 — ABNORMAL HIGH (ref 7.35–7.45)
PO2 (ARTERIAL): 138 mmHg — ABNORMAL HIGH (ref 72.0–100.0)
PO2 (ARTERIAL): 62 mmHg — ABNORMAL LOW (ref 72.0–100.0)
PO2 (ARTERIAL): 75 mmHg (ref 72.0–100.0)

## 2018-12-18 LAB — ARTERIAL BLOOD GAS/LACTATE
%FIO2 (ARTERIAL): 100 %
%FIO2 (ARTERIAL): 80 %
BASE EXCESS (ARTERIAL): 0.8 mmol/L (ref 0.0–1.0)
BASE EXCESS (ARTERIAL): 0.9 mmol/L (ref 0.0–1.0)
BICARBONATE (ARTERIAL): 25.6 mmol/L (ref 18.0–26.0)
BICARBONATE (ARTERIAL): 25.5 mmol/L (ref 18.0–26.0)
LACTATE: 3.3 mmol/L — ABNORMAL HIGH (ref 0.0–1.3)
LACTATE: 3.3 mmol/L — ABNORMAL HIGH (ref 0.0–1.3)
PAO2/FIO2 RATIO: 195 (ref <=200)
PAO2/FIO2 RATIO: 83 (ref <=200)
PCO2 (ARTERIAL): 34 mmHg — ABNORMAL LOW (ref 35.0–45.0)
PCO2 (ARTERIAL): 33 mmHg — ABNORMAL LOW (ref 35.0–45.0)
PH (ARTERIAL): 7.46 — ABNORMAL HIGH (ref 7.35–7.45)
PH (ARTERIAL): 7.47 — ABNORMAL HIGH (ref 7.35–7.45)
PO2 (ARTERIAL): 195 mmHg — ABNORMAL HIGH (ref 72.0–100.0)
PO2 (ARTERIAL): 66 mmHg — ABNORMAL LOW (ref 72.0–100.0)

## 2018-12-18 LAB — TROPONIN-I: TROPONIN I: 4364 ng/L (ref 0–30)

## 2018-12-18 LAB — BASIC METABOLIC PANEL
ANION GAP: 12 mmol/L (ref 4–13)
ANION GAP: 12 mmol/L (ref 4–13)
ANION GAP: 13 mmol/L (ref 4–13)
ANION GAP: 15 mmol/L — ABNORMAL HIGH (ref 4–13)
BUN/CREA RATIO: 26 — ABNORMAL HIGH (ref 6–22)
BUN/CREA RATIO: 29 — ABNORMAL HIGH (ref 6–22)
BUN/CREA RATIO: 29 — ABNORMAL HIGH (ref 6–22)
BUN/CREA RATIO: 30 — ABNORMAL HIGH (ref 6–22)
BUN: 35 mg/dL — ABNORMAL HIGH (ref 8–25)
BUN: 36 mg/dL — ABNORMAL HIGH (ref 8–25)
BUN: 37 mg/dL — ABNORMAL HIGH (ref 8–25)
BUN: 38 mg/dL — ABNORMAL HIGH (ref 8–25)
CALCIUM: 9 mg/dL (ref 8.5–10.2)
CALCIUM: 9.2 mg/dL (ref 8.5–10.2)
CALCIUM: 9.2 mg/dL (ref 8.5–10.2)
CALCIUM: 9.2 mg/dL (ref 8.5–10.2)
CHLORIDE: 107 mmol/L (ref 96–111)
CHLORIDE: 108 mmol/L (ref 96–111)
CHLORIDE: 108 mmol/L (ref 96–111)
CHLORIDE: 109 mmol/L (ref 96–111)
CO2 TOTAL: 22 mmol/L (ref 22–32)
CO2 TOTAL: 23 mmol/L (ref 22–32)
CO2 TOTAL: 23 mmol/L (ref 22–32)
CO2 TOTAL: 26 mmol/L (ref 22–32)
CREATININE: 1.19 mg/dL (ref 0.62–1.27)
CREATININE: 1.24 mg/dL (ref 0.62–1.27)
CREATININE: 1.31 mg/dL — ABNORMAL HIGH (ref 0.62–1.27)
CREATININE: 1.36 mg/dL — ABNORMAL HIGH (ref 0.62–1.27)
ESTIMATED GFR: >60 mL/min/{1.73_m2} (ref >60)
ESTIMATED GFR: >60 mL/min/{1.73_m2} (ref >60)
ESTIMATED GFR: >60 mL/min/{1.73_m2} (ref >60)
ESTIMATED GFR: >60 mL/min/{1.73_m2} (ref >60)
GLUCOSE: 186 mg/dL — ABNORMAL HIGH (ref 65–139)
GLUCOSE: 201 mg/dL — ABNORMAL HIGH (ref 65–139)
GLUCOSE: 215 mg/dL — ABNORMAL HIGH (ref 65–139)
GLUCOSE: 217 mg/dL — ABNORMAL HIGH (ref 65–139)
POTASSIUM: 3.9 mmol/L (ref 3.5–5.1)
POTASSIUM: 4 mmol/L (ref 3.5–5.1)
POTASSIUM: 4.1 mmol/L (ref 3.5–5.1)
POTASSIUM: 4.2 mmol/L (ref 3.5–5.1)
SODIUM: 143 mmol/L (ref 136–145)
SODIUM: 144 mmol/L (ref 136–145)
SODIUM: 145 mmol/L (ref 136–145)
SODIUM: 146 mmol/L — ABNORMAL HIGH (ref 136–145)

## 2018-12-18 LAB — ARTERIAL BLOOD GAS
%FIO2 (ARTERIAL): 100 %
%FIO2 (ARTERIAL): 70 %
%FIO2 (ARTERIAL): 80 %
PAO2/FIO2 RATIO: 138 (ref <=200)
PAO2/FIO2 RATIO: 89 (ref <=200)
PAO2/FIO2 RATIO: 94 (ref <=200)

## 2018-12-18 LAB — CBC WITH DIFF
BASOPHIL #: <0.1 10*3/uL (ref <=0.20)
BASOPHIL %: 0 %
EOSINOPHIL #: <0.1 10*3/uL (ref <=0.50)
EOSINOPHIL %: 0 %
HCT: 44.9 % (ref 38.9–52.0)
HGB: 15.1 g/dL (ref 13.4–17.5)
IMMATURE GRANULOCYTE #: <0.1 10*3/uL (ref <0.10)
IMMATURE GRANULOCYTE %: 0 % (ref 0–1)
LYMPHOCYTE #: 0.48 10*3/uL — ABNORMAL LOW (ref 1.00–4.80)
LYMPHOCYTE %: 4 %
MCH: 30.9 pg (ref 26.0–32.0)
MCHC: 33.6 g/dL (ref 31.0–35.5)
MCV: 92 fL (ref 78.0–100.0)
MONOCYTE #: 0.17 10*3/uL — ABNORMAL LOW (ref 0.20–1.10)
MONOCYTE %: 1 %
MPV: 10.7 fL (ref 8.7–12.5)
NEUTROPHIL #: 12.83 10*3/uL — ABNORMAL HIGH (ref 1.50–7.70)
NEUTROPHIL %: 95 %
PLATELETS: 189 10*3/uL (ref 150–400)
RBC: 4.88 10*6/uL (ref 4.50–6.10)
RDW-CV: 14.5 % (ref 11.5–15.5)
WBC: 13.6 10*3/uL — ABNORMAL HIGH (ref 3.7–11.0)

## 2018-12-18 LAB — HEPATIC FUNCTION PANEL
ALBUMIN: 3.1 g/dL — ABNORMAL LOW (ref 3.5–5.0)
ALBUMIN: 3.2 g/dL — ABNORMAL LOW (ref 3.5–5.0)
ALKALINE PHOSPHATASE: 77 U/L (ref 45–115)
ALKALINE PHOSPHATASE: 81 U/L (ref 45–115)
ALT (SGPT): 109 U/L — ABNORMAL HIGH (ref <55)
ALT (SGPT): 126 U/L — ABNORMAL HIGH (ref <55)
AST (SGOT): 177 U/L — ABNORMAL HIGH (ref 8–48)
AST (SGOT): 196 U/L — ABNORMAL HIGH (ref 8–48)
BILIRUBIN DIRECT: 0.2 mg/dL (ref <0.3)
BILIRUBIN DIRECT: 0.3 mg/dL — ABNORMAL HIGH (ref <0.3)
BILIRUBIN TOTAL: 0.6 mg/dL (ref 0.3–1.3)
BILIRUBIN TOTAL: 0.7 mg/dL (ref 0.3–1.3)
PROTEIN TOTAL: 6.8 g/dL (ref 6.4–8.3)
PROTEIN TOTAL: 6.9 g/dL (ref 6.4–8.3)

## 2018-12-18 LAB — MAGNESIUM
MAGNESIUM: 1.9 mg/dL (ref 1.6–2.6)
MAGNESIUM: 2 mg/dL (ref 1.6–2.6)
MAGNESIUM: 2 mg/dL (ref 1.6–2.6)
MAGNESIUM: 2.1 mg/dL (ref 1.6–2.6)

## 2018-12-18 LAB — BAL FLUID COUNT
RBC COUNT: 19 /uL
WBC COUNT: 1000 /uL

## 2018-12-18 LAB — PHOSPHORUS
PHOSPHORUS: 3.3 mg/dL (ref 2.4–4.7)
PHOSPHORUS: 3.6 mg/dL (ref 2.4–4.7)
PHOSPHORUS: 4.2 mg/dL (ref 2.4–4.7)
PHOSPHORUS: 4.4 mg/dL (ref 2.4–4.7)

## 2018-12-18 LAB — POC BLOOD GLUCOSE (RESULTS)
GLUCOSE, POC: 176 mg/dL — ABNORMAL HIGH (ref 70–105)
GLUCOSE, POC: 188 mg/dL — ABNORMAL HIGH (ref 70–105)
GLUCOSE, POC: 194 mg/dL — ABNORMAL HIGH (ref 70–105)
GLUCOSE, POC: 205 mg/dL — ABNORMAL HIGH (ref 70–105)
GLUCOSE, POC: 210 mg/dL — ABNORMAL HIGH (ref 70–105)

## 2018-12-18 LAB — BAL FLUID MANUAL DIFFERENTIAL
BRONCHIAL EPITHELIAL CELL %: 10 %
LYMPHOCYTE %: 13 %
MONOCYTE/MACROPHAGE %: 16 %
NEUTROPHIL %: 61 %

## 2018-12-18 LAB — CBC
HCT: 44.1 % (ref 38.9–52.0)
HCT: 45.8 % (ref 38.9–52.0)
HGB: 14.8 g/dL (ref 13.4–17.5)
HGB: 15.6 g/dL (ref 13.4–17.5)
MCH: 30.8 pg (ref 26.0–32.0)
MCH: 31 pg (ref 26.0–32.0)
MCHC: 33.6 g/dL (ref 31.0–35.5)
MCHC: 34.1 g/dL (ref 31.0–35.5)
MCV: 91.1 fL (ref 78.0–100.0)
MCV: 91.9 fL (ref 78.0–100.0)
MPV: 10.4 fL (ref 8.7–12.5)
MPV: 11.2 fL (ref 8.7–12.5)
PLATELETS: 192 10*3/uL (ref 150–400)
PLATELETS: 195 10*3/uL (ref 150–400)
RBC: 4.8 10*6/uL (ref 4.50–6.10)
RBC: 5.03 10*6/uL (ref 4.50–6.10)
RDW-CV: 14.5 % (ref 11.5–15.5)
RDW-CV: 14.6 % (ref 11.5–15.5)
WBC: 12.6 10*3/uL — ABNORMAL HIGH (ref 3.7–11.0)
WBC: 15.2 10*3/uL — ABNORMAL HIGH (ref 3.7–11.0)

## 2018-12-18 LAB — AMPHETAMINES, CONFIRMATION, URINE
AMPHETAMINE-BY GC/MS: 448 ng/mL
INTERPRETATION: POSITIVE
MDA (ECSTASY METABOLITE)-BY GC/MS: NEGATIVE ng/mL
MDMA (ECSTASY)-BY GC/MS: NEGATIVE ng/mL
METHAMPHETAMINE-BY GC/MS: 1424 ng/mL
PHENTERMINE-BY GC/MS: NEGATIVE ng/mL
PSEUDOEPHEDRINE/EPHEDRINE-BY GC/MS: NEGATIVE ng/mL

## 2018-12-18 LAB — PT/INR
INR: 1.23 — ABNORMAL HIGH (ref 0.80–1.20)
INR: 1.43 — ABNORMAL HIGH (ref 0.80–1.20)
PROTHROMBIN TIME: 14.2 s — ABNORMAL HIGH (ref 9.1–13.9)
PROTHROMBIN TIME: 16.6 s — ABNORMAL HIGH (ref 9.1–13.9)

## 2018-12-18 LAB — PTT (PARTIAL THROMBOPLASTIN TIME)
APTT: 26.3 s (ref 24.2–37.5)
APTT: 28.4 s (ref 24.2–37.5)

## 2018-12-18 LAB — COVID-19 ~~LOC~~ MOLECULAR LAB TESTING: SARS-CoV-2: NOT DETECTED

## 2018-12-18 LAB — CARBOXY - TETRAHYDROCANNABINOL (THC) CONFIRMATION, URINE
INTERPRETATION: POSITIVE
THC CARBOXYLIC ACID-BY GC/MS: 147 ng/mL

## 2018-12-18 MED ORDER — MIDAZOLAM 1 MG/ML INJECTION SOLUTION
2.0000 mg | INTRAMUSCULAR | Status: DC | PRN
Start: 2018-12-18 — End: 2018-12-22
  Administered 2018-12-18 (×2): 2 mg via INTRAVENOUS
  Filled 2018-12-18 (×2): qty 2

## 2018-12-18 MED ORDER — FUROSEMIDE 10 MG/ML INJECTION SOLUTION
20.0000 mg | Freq: Once | INTRAMUSCULAR | Status: AC
Start: 2018-12-18 — End: 2018-12-18

## 2018-12-18 MED ORDER — FUROSEMIDE 10 MG/ML INJECTION SOLUTION
INTRAMUSCULAR | Status: AC
Start: 2018-12-18 — End: 2018-12-18
  Administered 2018-12-18: 20 mg via INTRAVENOUS
  Filled 2018-12-18: qty 4

## 2018-12-18 MED ORDER — HYDRALAZINE 20 MG/ML INJECTION SOLUTION
10.00 mg | INTRAMUSCULAR | Status: DC | PRN
Start: 2018-12-18 — End: 2018-12-22
  Administered 2018-12-18 – 2018-12-21 (×6): 10 mg via INTRAVENOUS
  Filled 2018-12-18 (×5): qty 1

## 2018-12-18 NOTE — Care Plan (Signed)
VENTILATOR - SIMV(PRVC)PS / APV-SIMV CONTINUOUS    Discontinue   Duration: Until Specified    Priority: Routine       Question Answer Comment   FIO2 (%) 70    Peep(cm/H2O) 8    Rate(bpm) 10    Tidal Volume(mls) 480    Pressure Support(cm/H2O) 10    Indications IMPROVE DISTRIBUTION OF VENTILATION              Patient on above ventilator settings.  Core involved in care.  Q4 lung recruitments per core.  Will continue to monitor.

## 2018-12-18 NOTE — Care Plan (Signed)
made full code for CORE. CT CAP, COVID swabbed.     POC: Bronch in AM, to OR for CORE donations

## 2018-12-18 NOTE — Progress Notes (Signed)
[facility]                                                     MICU PROGRESS NOTE    Jon Furlongeets, Jon Jr., 38 y.o. male  Date of Admission:  12/07/2018  Date of service: 12/18/2018  Date of Birth:  06/28/1980    Jon FurlongJimmy Dawood Jr. is a 38 y.o. 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      Subjective:  NAEON. GCS 1/1/1 and with no protective reflexes. Patient intubated, on vent.    Discussed prognosis and organ donation with family yesterday. CORE involved. Will still need Bronch, currently pending covid 19 swab. Central line and A line placed yesterday.    Vital Signs:  Temp (24hrs) Max:39.3 C (102.7 F)      Systolic (24hrs), Avg:137 , Min:113 , Max:166     Diastolic (24hrs), Avg:95, Min:80, Max:107    Temp  Avg: 38.5 C (101.3 F)  Min: 37.8 C (100 F)  Max: 39.3 C (102.7 F)  MAP (Non-Invasive)  Avg: 107.7 mmHG  Min: 90 mmHG  Max: 122 mmHG  Pulse  Avg: 89.7  Min: 81  Max: 102  Resp  Avg: 29.2  Min: 22  Max: 35  SpO2  Avg: 92.8 %  Min: 90 %  Max: 98 %       ART-Line  MAP: 110 mmHg    Ventilator Settings: Conventional settings:  Set VT: 480 mL  Set Rate: 10 Breaths Per Minute  Set PEEP: 8 cmH2O  Pressure Support: 10 cmH2O  FiO2: 70 %    Blood Gas Results:  Recent Labs     12/17/18  2356 12/18/18  0135 12/18/18  0534   FI02 80 100 80   PH 7.49* 7.46* 7.47*   PCO2 32.0* 34.0* 33.0*   PO2 75.0 195.0* 66.0*   BICARBONATE 26.2* 25.6 25.5   BASEEXCESS 1.7* 0.8 0.9              Current Medications:  acetaminophen (TYLENOL) tablet, 650 mg, Gastric (NG, OG, PEG, GT), Q4H PRN  amLODIPine (NORVASC) tablet, 5 mg, Gastric (NG, OG, PEG, GT), Daily  cefepime (MAXIPIME) 2 g in NS 100 mL IVPB, 2 g, Intravenous, Q12H  chlorhexidine gluconate (PERIDEX) 0.12% mouthwash, 15 mL, Swish & Spit, 2x/day  docusate sodium (COLACE) 10mg  per mL oral liquid, 100 mg,  Gastric (NG, OG, PEG, GT), 2x/day  famotidine (PEPCID) 40mg  per 5mL oral liquid, 20 mg, Gastric (NG, OG, PEG, GT), 2x/day  fentaNYL (SUBLIMAZE) 50 mcg/mL injection, 100 mcg, Intravenous, Q15 Min PRN  glycopyrrolate (ROBINUL) 100 mcg per mL oral liquid, 4 mcg/kg, Gastric (NG, OG, PEG, GT), Q8H  heparin 5,000 unit/mL injection, 5,000 Units, Subcutaneous, Q8HRS  hydrALAZINE (APRESOLINE) injection 10 mg, 10 mg, Intravenous, Q6H PRN  lactulose (ENULOSE) 20g per 30mL oral liquid, 30 mL, Gastric (NG, OG, PEG, GT), 2x/day  LR premix infusion, , Intravenous, Continuous  methylPREDNISolone (SOLU-MEDROL) 1,000 mg in NS 50 mL IVPB, 1,000 mg, Intravenous, Q12H  NS flush syringe, 2 mL, Intracatheter, Q8HRS    And  NS flush syringe, 2-6 mL, Intracatheter, Q1 MIN PRN  SSIP insulin lispro (HUMALOG) 100 units/mL injection, 0-12 Units, Subcutaneous, Q4H PRN  vancomycin (VANCOCIN) 1,500 mg  in NS 500 mL IVPB, 18 mg/kg (Adjusted), Intravenous, Q24H        Today's Physical Exam:  Constitutional: acutely ill, no distress and intubated  Eyes: Conjunctiva clear., Pupils equal and round. , abnormal findings: pupils non-reactive and pinpoint  ENT: Mouth mucous membranes moist.   Neck: supple, symmetrical, trachea midline  Respiratory: Clear to auscultation bilaterally. , no wheezes. Intubated and mechanically ventilated.  Cardiovascular: tachycardia, regular rhythm, no murmur. No peripheral edema.  Gastrointestinal: Soft, non-tender, non-distended  Genitourinary: Deferred  Musculoskeletal: Head atraumatic and normocephalic.   Integumentary:  Skin warm and dry. Bilateral lower extremity excoriations. Needle track marks upper extremities.  Psychiatric: Unable to assess given patient unresposive    Neurologic Exam:  Doll's eye reflex: Negative  Corneal reflex: absent  Gag reflex: absent  Cough reflex: absent  Caloric reflex test with cold water: No response  Pupillometer reaction: Left: 0 (No reaction)             Right: 0 (No  reaction)    Labs:  I have reviewed all lab results.      I/O:  I/O last 24 hours:      Intake/Output Summary (Last 24 hours) at 12/18/2018 1155  Last data filed at 12/18/2018 1100  Gross per 24 hour   Intake 1162 ml   Output 1895 ml   Net -733 ml     I/O current shift:  07/30 0700 - 07/30 1859  In: 386 [I.V.:386]  Out: 1000 [Urine:1000]    Drips:   Current Facility-Administered Medications   Medication Dose Frequency Last Rate   . LR premix infusion   Continuous 50 mL/hr at 12/17/18 2129       Lines/Drains:   Patient Lines/Drains/Airways Status    Active Line / Dialysis Catheter / Dialysis Graft / Drain / Airway / Wound     Name: Placement date: Placement time: Site: Days:    Peripheral IV Right Median Cubital  (antecubital fossa)  --   --       Peripheral IV Left Forearm  12/06/2018   0637   3    Peripheral IV Right;Lower Forearm  12/06/2018   0646   3    Peripheral IV Ultrasound guided;Extended dwell catheter Left Forearm  12/16/18   1404   1    Central Triple Lumen Right Internal Jugular  12/17/18   1816   less than 1    Arterial Line Left Radial  12/17/18   1818   Radial  less than 1    Oral Gastric Tube Center  --   --       Foley Catheter  11/25/2018   8850   3    EndoTracheal Tube Oral 7.5 23 cm Lip  11/21/2018   0500   3              Respiratory Secretions: none reported    Prophylaxis:  Date Started Date Completed   DVT/PE  Heparin 11/27/2018    GI: H2 blocker       Sedation/ Analgesia/ Paralysis: (SAS score and Drug Free Holiday)         Antibiotics: Date Started Date Completed   1. Ampicillin-sulbactam 12/03/2018    2.     3.     4.       Nutrition/Residuals:  MNT PROTOCOL FOR DIETITIAN  DIET NPO - NOW  ADULT TUBE FEEDING - CONTINUOUS DRIP CONTINUOUS NO MEALS, TF ONLY; GLUCERNA 1.5; OG; Initial Rate (ml/hr): 10  Assessment/ Plan:  Active Hospital Problems    Diagnosis   . Cardiac arrest (CMS HCC)   . Hypokalemia     Jon Phillips Grouteets Jr. is a 38 y.o. male with PMH s/f IVDU with profound hypoxic ischemic encephalopathy  post-cardiac arrest (found down, 15 minutes of CPR before ROSC likely 2/2 opioid overdose).      #1. Profound Hypoxic Ischemic Encephalopathy following Cardiac arrest 2/2 Opioid OD.     With resulting Hypoxic hypercarbic respiratory failure   -Patient GCS 3 with loss of corneal, gag, cough reflexes   - Secondary to IVDU and overdose with Utox positive for multiple substances   - Brain MRI showed findings of extensive hypoxic ischemic encephalopathy.     -discussed with Family   -Management per/with CORE    - Continuous EEG    - nasyn 3g Q6H   - tube feeds at 5210mL/hr   -Repeat TTE per CORE   -Bronchoscopy for later today per CORE -- awaiting neg covid   - Trending ABG, BMP, Mg, Phos Q6H    #Fever   -Related to Neurologic injury / post-cardiac arrest care vs. Infectious.   -WBC 13.6   -Tylenol PRN   -Hypo/Hyperthermia blanket    #Hyperglycemia   -217 today   -on Low dose sliding scale lispro.    #Heavy Secretions   -Robinul    #Acute kidney injury   - Cr 1.19 today   - LR at 6250mL/hr   - Monitor with daily BMP    #Transaminitis   - AST 188, ALT 92   - Suspect secondary to alcohol use  ___________________________________      Prophylaxis:   DVT: Heparin   GI: H2 blocker   Analgesia: none   Nutrition: NPO   Sedation: none    Jon Boninerevor Aldred, MD, PGY-1    Staff addendum:    38 yo male s/p cardiac arrest with anoxic brain injury.Now a CORE patient. PLan for bronch after COVID screen has resulted.    Critical Care Time:  Total critical care time spent in direct care of this patient at high risk based on presenting history/exam/and complaint, including initial evaluation and stabilization, review of data, re-examination, discussion with admitting and consulting services to arrange definitive care, and exclusive of any procedures performed, was 15 minutes.    Jon Parrish ,MD  Assistant Professor,Section of Pulmonary ,Critical Care and Sleep Medicine  Department of Medicine, [facility]

## 2018-12-18 NOTE — Procedures (Signed)
Bronchoscopy Patient       Procedure Date:  12/18/2018 Time:  15:00  Procedure: Bronchoscopy  Diagnosis:  Respiratory failure, anoxic brain injury   Indication:  Deep sampling     Description: Sterile conditions were maintained for the procedure. The patient underwent fiber-optic bronchoscopy through the endotracheal tube. The trachea was normal in appearance and the carina was sharp. All airways were inspected to the level of segmental bronchi. The airways were normal and lobar and segmental bronchi were patent. The no endobronchial lesions or other anatomical abnormality. A small amount of thick secretions were encountered in the left lower lobe and aspirated as completely as possible. There was no bleeding identified.     The bronchoscopy was performed without flouro. A Bronchial Alveolar Lavage was performed in the right middle lobe (RML)lobe(s). The samples were sent to the lab for cell counts and cultures.     The bronchoscope was removed at the conclusion of the examination.  Patient tolerated procedure without complications.       Blair Heys, MD  Fellow, Section of Pulmonary, Critical Care, and Sleep Medicine  [place] [facility]  12/18/2018 18:21     I was present in the room and supervised the entire procedure.    Oley Balm ,MD  Assistant Professor,Section of Pulmonary ,Critical Care and Sleep Medicine  Department of Medicine, [facility]

## 2018-12-18 NOTE — Care Management Notes (Signed)
[facility]  Care Management Note    Patient Name: Jon Parrish.  Date of Birth: [birth year]  Sex: male  Date/Time of Admission: 11/27/2018  5:56 AM  Room/Bed: 06/A  Payor: /    LOS: 3 days   Primary Care Providers:  Pcp, No (General)    Admitting Diagnosis:  Cardiac arrest (CMS [facility]) [I46.9]    Assessment:      12/18/18 1449   Assessment Details   Assessment Type Continued Assessment   Date of Care Management Update 12/18/18   Date of Next DCP Update 12/19/18   Care Management Plan   Discharge Planning Status plan in progress   Projected Discharge Date 12/18/18   CM will evaluate for rehabilitation potential yes   Discharge Needs Assessment   Discharge Facility/Level of Care Needs Undetermined at this time     CORE involved  Bronch planned.  Pending COVID 19 swab.    Discharge Plan:  Undetermined at this time      The patient will continue to be evaluated for developing discharge needs.     Case Manager: Vertell Limber  Phone: [phone]

## 2018-12-18 NOTE — Consults (Signed)
[facility]  Child Life Specialist Consult Note    Jon Parrish, Jon Parrish.  Date of Birth:  09-24-1980  Unit: MICU  LOS:  3 days  Date of consult: 12/18/2018    The Child Life Specialist has initiated contact with bedside RN to provide memory making items at for pt's 38yo daughter. Daughter not present at time of visit and no family at bedside. Will continue to follow, offer support as needed, throughout this hospitalization when available.     Child Life Provided:  Resources to the [facility]      Michelene Gardener, CCLS  12/18/2018, 14:49  Michelene Gardener, [place]  Phone number:  [phone]

## 2018-12-19 ENCOUNTER — Inpatient Hospital Stay (HOSPITAL_COMMUNITY): Payer: Self-pay

## 2018-12-19 DIAGNOSIS — Z978 Presence of other specified devices: Secondary | ICD-10-CM

## 2018-12-19 DIAGNOSIS — J982 Interstitial emphysema: Secondary | ICD-10-CM

## 2018-12-19 LAB — BASIC METABOLIC PANEL
ANION GAP: 11 mmol/L (ref 4–13)
BUN/CREA RATIO: 29 — ABNORMAL HIGH (ref 6–22)
BUN: 41 mg/dL — ABNORMAL HIGH (ref 8–25)
CALCIUM: 8.7 mg/dL (ref 8.5–10.2)
CHLORIDE: 109 mmol/L (ref 96–111)
CO2 TOTAL: 26 mmol/L (ref 22–32)
CREATININE: 1.42 mg/dL — ABNORMAL HIGH (ref 0.62–1.27)
ESTIMATED GFR: >60 mL/min/{1.73_m2} (ref >60)
GLUCOSE: 181 mg/dL — ABNORMAL HIGH (ref 65–139)
POTASSIUM: 4.4 mmol/L (ref 3.5–5.1)
SODIUM: 146 mmol/L — ABNORMAL HIGH (ref 136–145)

## 2018-12-19 LAB — ARTERIAL BLOOD GAS/LACTATE
%FIO2 (ARTERIAL): 80 %
BASE EXCESS (ARTERIAL): 3.6 mmol/L — ABNORMAL HIGH (ref 0.0–1.0)
BICARBONATE (ARTERIAL): 27.7 mmol/L — ABNORMAL HIGH (ref 18.0–26.0)
LACTATE: 1.2 mmol/L (ref 0.0–1.3)
PAO2/FIO2 RATIO: 99 (ref <=200)
PCO2 (ARTERIAL): 52 mmHg — ABNORMAL HIGH (ref 35.0–45.0)
PH (ARTERIAL): 7.37 (ref 7.35–7.45)
PO2 (ARTERIAL): 79 mmHg (ref 72.0–100.0)

## 2018-12-19 LAB — CBC WITH DIFF
BASOPHIL #: <0.1 10*3/uL (ref <=0.20)
BASOPHIL %: 0 %
EOSINOPHIL #: <0.1 10*3/uL (ref <=0.50)
EOSINOPHIL %: 0 %
HCT: 42.6 % (ref 38.9–52.0)
HGB: 13.7 g/dL (ref 13.4–17.5)
IMMATURE GRANULOCYTE #: 0.1 10*3/uL — ABNORMAL HIGH (ref <0.10)
IMMATURE GRANULOCYTE %: 1 % (ref 0–1)
LYMPHOCYTE #: 0.88 10*3/uL — ABNORMAL LOW (ref 1.00–4.80)
LYMPHOCYTE %: 4 %
MCH: 30.4 pg (ref 26.0–32.0)
MCHC: 32.2 g/dL (ref 31.0–35.5)
MCV: 94.7 fL (ref 78.0–100.0)
MONOCYTE #: 0.84 10*3/uL (ref 0.20–1.10)
MONOCYTE %: 4 %
MPV: 10.2 fL (ref 8.7–12.5)
NEUTROPHIL #: 18.98 10*3/uL — ABNORMAL HIGH (ref 1.50–7.70)
NEUTROPHIL %: 91 %
PLATELETS: 192 10*3/uL (ref 150–400)
RBC: 4.5 10*6/uL (ref 4.50–6.10)
RDW-CV: 14.8 % (ref 11.5–15.5)
WBC: 20.8 10*3/uL — ABNORMAL HIGH (ref 3.7–11.0)

## 2018-12-19 LAB — POC BLOOD GLUCOSE (RESULTS)
GLUCOSE, POC: 123 mg/dL — ABNORMAL HIGH (ref 70–105)
GLUCOSE, POC: 155 mg/dL — ABNORMAL HIGH (ref 70–105)
GLUCOSE, POC: 163 mg/dL — ABNORMAL HIGH (ref 70–105)
GLUCOSE, POC: 180 mg/dL — ABNORMAL HIGH (ref 70–105)

## 2018-12-19 LAB — MAGNESIUM: MAGNESIUM: 2.2 mg/dL (ref 1.6–2.6)

## 2018-12-19 LAB — PHOSPHORUS: PHOSPHORUS: 4.5 mg/dL (ref 2.4–4.7)

## 2018-12-19 MED ORDER — ACETAMINOPHEN 650 MG RECTAL SUPPOSITORY
325.0000 mg | Freq: Once | RECTAL | Status: AC
Start: 2018-12-19 — End: 2018-12-19
  Administered 2018-12-19: 325 mg via RECTAL
  Filled 2018-12-19: qty 1

## 2018-12-19 NOTE — Care Plan (Signed)
Patient GCS 1-1-1, started tube feeding with goal of 60 ml/hr, increase by 10 q6. Plan is to wait on family to come see him and proceed with CORE Sunday into Monday.         Problem: Adult Inpatient Plan of Care  Goal: Plan of Care Review  Outcome: Ongoing (see interventions/notes)  Goal: Patient-Specific Goal (Individualized)  Outcome: Ongoing (see interventions/notes)  Flowsheets (Taken 12/19/2018 1744)  Individualized Care Needs: keep temp down  Anxieties, Fears or Concerns: UTA ETT  Goal: Absence of Hospital-Acquired Illness or Injury  Outcome: Ongoing (see interventions/notes)  Goal: Optimal Comfort and Wellbeing  Outcome: Ongoing (see interventions/notes)  Goal: Rounds/Family Conference  Outcome: Ongoing (see interventions/notes)     Problem: Communication Impairment (Mechanical Ventilation, Invasive)  Goal: Effective Communication  Outcome: Ongoing (see interventions/notes)     Problem: Device-Related Complication Risk (Mechanical Ventilation, Invasive)  Goal: Optimal Device Function  Outcome: Ongoing (see interventions/notes)     Problem: Inability to Wean (Mechanical Ventilation, Invasive)  Goal: Mechanical Ventilation Liberation  Outcome: Ongoing (see interventions/notes)     Problem: Nutrition Impairment (Mechanical Ventilation, Invasive)  Goal: Optimal Nutrition Delivery  Outcome: Ongoing (see interventions/notes)     Problem: Skin and Tissue Injury (Mechanical Ventilation, Invasive)  Goal: Absence of Device-Related Skin and Tissue Injury  Outcome: Ongoing (see interventions/notes)     Problem: Ventilator-Induced Lung Injury (Mechanical Ventilation, Invasive)  Goal: Absence of Ventilator-Induced Lung Injury  Outcome: Ongoing (see interventions/notes)     Problem: Fall Injury Risk  Goal: Absence of Fall and Fall-Related Injury  Outcome: Ongoing (see interventions/notes)     Problem: Skin Injury Risk Increased  Goal: Skin Health and Integrity  Outcome: Ongoing (see interventions/notes)

## 2018-12-19 NOTE — Care Plan (Signed)
Medical Nutrition Therapy Follow Up         SUBJECTIVE : Pt intubated.     OBJECTIVE:     Current Diet Order/Nutrition Support:  MNT PROTOCOL FOR DIETITIAN  DIET NPO - SPECIFIC DATE & TIME  ADULT TUBE FEEDING - CONTINUOUS DRIP CONTINUOUS NO MEALS, TF ONLY; GLUCERNA 1.5; OG; Initial Rate (ml/hr): 10; Increase by: 10 ml/hr Q 6 hours; Goal Rate (ml/hr): 60     Provides: 2160 cal = 28 cals/kg                119 g protein = 1.5 g/kg                1092 ml free water from formula       Height Used for Calculations: 190.5 cm (6\' 3" )  Weight Used For Calculations: 76.8 kg (169 lb 5 oz)(Prior to cooling blanket on bed )  BMI (kg/m2): 21.21  BMI Assessment: BMI 18.5-24.9: normal  Ideal Body Weight (IBW) (kg): 90.45  % Ideal Body Weight: 84.91           Estimated Needs:    Energy Calorie Requirements: 1950-2300 per day (25-30kcals/76.8 kg)  Protein Requirements (gms/day): 115-138 per day (1.5-1.8 g/76.8 kg)        Comments: 38 y.o. White male with PMH significant for IV drug use presenting with drug overdose.   Pt presented with hyperglycemia requiring insulin drip that is discontinued.  Receiving SSI Protocol. Unknown if Pt has Hx of DM.   Pt currently on cooling protocol. To start rewarming at 5 PM.  Per team trickle feeds to start after 5 PM.   Pt possibly at risk for refeeding due to possibly having poor po intake based on admit weight and IVDU.      12/17/2018 23:56 12/18/2018 05:34 12/18/2018 12:03 12/18/2018 18:03 12/19/2018 00:24   SODIUM 144 143 145 146 (H) 146 (H)   POTASSIUM 4.1 4.2 3.9 4.0 4.4   MAGNESIUM 2.0 2.0 1.9 2.1 2.2   PHOSPHORUS 3.6 3.3 4.2 4.4 4.5         Plan/Interventions :   1. Advancing TF to goal     Tube Feed Formula : Glucerna 1.5   Goal Rate: 60 ml/hr   Provides: 2160 cal = 28 cals/kg                119 g protein = 1.5 g/kg                1092 ml free water from formula     2. Na trending upward; recommend add free water flushes (receiving LR at 50 ml/hr)     3. Monitor electrolytes; supplement as  needed  4. Monitor glucose; SSI Protocol as needed   5. Monitor weight trend         Nutrition Diagnosis: Inability to swallow related to Patient on vent as evidenced by Need for TF (ongoing)     Keiona Jenison L. [place], [place], LD, [place]   12/19/2018     Pager # [phone]

## 2018-12-19 NOTE — Nurses Notes (Signed)
12/19/18 0340   Vital Signs   Temperature (!) 39.1 C (102.4 F)       MICU service notified of increase in temperature while receiving gastric tylenol and on cooling blanket. Orders for rectal tylenol.

## 2018-12-19 NOTE — Care Plan (Signed)
VENTILATOR - CPAP(PS) / SPONTANEOUS CONTINUOUS    Discontinue   Duration: Until Specified    Priority: Routine       Question Answer Comment   FIO2 (%) 80    Peep(cm/H2O) 8    Pressure Support(cm/H2O) 10    Indications IMPROVE DISTRIBUTION OF VENTILATION    Invasive/Non-Invasive INVASIVE           Pt remains on the ventilator with the settings above. Pt was weaned to PSV with no issues. Pts O2 was increased throughout the night due to desaturations. respiratory to follow and wean pt as tolerated

## 2018-12-19 NOTE — Care Management Notes (Signed)
[facility]  Care Management Note    Patient Name: Jon Parrish.  Date of Birth: 08-07-1980  Sex: male  Date/Time of Admission: 01/11/19  5:56 AM  Room/Bed: 06/A  Payor: /    LOS: 4 days   Primary Care Providers:  Pcp, No (General)    Admitting Diagnosis:  Cardiac arrest (CMS [facility]) [I46.9]    Assessment:      12/19/18 1525   Assessment Details   Assessment Type Continued Assessment   Date of Care Management Update 12/19/18   Date of Next DCP Update 01/04/2019   Care Management Plan   Discharge Planning Status plan in progress   Projected Discharge Date 01/05/2019   CM will evaluate for rehabilitation potential no   Discharge Needs Assessment   Discharge Facility/Level of Care Needs Expired (code 20)     Plan for pt to OR on August 3 for CORE donations.  Discharge Plan:  Expired (code 20)      The patient will continue to be evaluated for developing discharge needs.     Case Manager: Vertell Limber  Phone: [phone]

## 2018-12-19 NOTE — Progress Notes (Signed)
Medical Intensive Care Unit  PROGRESS NOTE     Name: Jon, Parrish., 38 y.o. male Date of service: 12/19/2018   Room: 06/A  LOS: 4    MRN: [id number] Attending: Claudie Revering, MD   Code Status: Full Code Admitted to MICU for:  Severe Anoxic Encephalopathy     SUBJECTIVE  INTERVAL EVENTS:   Jon Parrish. is a 38 y.o. 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      Daily updates: NAEON. GCS 1/1/1 and with no protective reflexes. Patient intubated, on vent. Patient febrile ON, on multiple ABX already, high dose steroids, cooling blanket, tylenol, CORE involved. Likely to OR 8/3.    OBJECTIVE:    VITAL SIGNS:  Temperature: 37.6 C (99.7 F) BP (Non-Invasive): 132/89 Heart Rate: 89   Respiratory Rate: (!) 90 SpO2: 91 % Weight: 77.5 kg (170 lb 13.7 oz)        INTAKE & OUTPUT:    Intake/Output Summary (Last 24 hours) at 12/19/2018 1544  Last data filed at 12/19/2018 1400  Gross per 24 hour   Intake 2041 ml   Output 1715 ml   Net 326 ml     Last Bowel Movement: 12/16/18      VENTILATOR SETTINGS:  Conventional settings:  Set VT: 480 mL  Set Rate: 10 Breaths Per Minute  Set PEEP: 8 cmH2O  Pressure Support: 10 cmH2O  FiO2: 75 %     LINES & DRAINS:   Patient Lines/Drains/Airways Status    Active Line / Dialysis Catheter / Dialysis Graft / Drain / Airway / Wound     Name: Placement date: Placement time: Site: Days:    Peripheral IV Right Median Cubital  (antecubital fossa)  -   -       Peripheral IV Left Forearm  11/29/2018   0637   4    Peripheral IV Right;Lower Forearm  11/20/2018   0646   4    Peripheral IV Ultrasound guided;Extended dwell catheter Left Forearm  12/16/18   1404   3    Central Triple Lumen Right Internal Jugular  12/17/18   1816   1    Arterial Line Left Radial  12/17/18   1818   Radial  1    Oral Gastric Tube Center  -   -       Foley Catheter  11/25/2018   5409   4    EndoTracheal Tube Oral 7.5  23 cm Lip  12/12/2018   0500   4                  DAILY EXAMINATION:  Physical Exam:  Constitutional: acutely ill, no distress and intubated  Eyes: Conjunctiva clear., Pupils equal and round. , abnormal findings: pupils non-reactive and pinpoint  ENT: Mouth mucous membranes moist.   Neck: supple, symmetrical, trachea midline  Respiratory: Clear to auscultation bilaterally. , no wheezes. Intubated and mechanically ventilated.  Cardiovascular: tachycardia, regular rhythm, no murmur. No peripheral edema.  Gastrointestinal: Soft, non-tender, non-distended  Genitourinary: Deferred  Musculoskeletal: Head atraumatic and normocephalic.   Integumentary:  Skin warm and dry. Bilateral lower extremity excoriations. Needle track marks upper extremities.  Psychiatric: Unable to assess given patient unresposive       LABS  IMAGING  MICROBIOLOGY  PATHOLOGY:     I have reviewed all of the recent labs, imaging, microbiology and pathology.  ASSESSMENT:     Jon FurlongJimmy Krell Jr. is a 38 y.o. 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      Daily Updates - febrile ON, tylenol, cooling blanket. CORE involved. OR 8/3 planned     PLAN:    Neuro   Profound Hypoxic Ischemic Encephalopathy following Cardiac arrest 2/2 Opioid OD.                               With resulting Hypoxic hypercarbic respiratory failure                 -Patient GCS 3 with loss of corneal, gag, cough reflexes                 - Secondary to IVDU and overdose with Utox positive for multiple substances                 - Brain MRI showed findings of extensive hypoxic ischemic encephalopathy.                           -discussed with Family                 -Management per/with CORE                  - Continuous EEG                        -Bronchoscopy completed                 - Trending ABG, BMP, Mg, Phos Q6H     Cardio   Trops 4,000, trending down  7/31   discussed with Team   NTD at this time     Pulm     Hypoxic hypercarbic respiratory failure 2/2 severe hypoxic encephalopathy    Pt on CPAP 80% FiO2    Daily ABG's pH 7.37 today    Otherwise Mgmt per CORE     GI   Tube feeds, 8010mL/hr, goal rate 60   Famotidine   Lactulose   Jon Parrish ordered for secretions     Renal   AKI, Cr 1.42   LR at 6350mL/hr   Monitor with daily BMP.      Endo   Pt on low dose sliding scale lispro     MSK/Skin   NTD     Infectious Disease    #Fever                  -Related to Neurologic injury / post-cardiac arrest care vs. Infectious.                  -WBC 13.6                  -Tylenol PRN                  -Hypo/Hyperthermia blanket      Heme/Onc   Hgb 13.7 7/31.    NTD     Prophylaxis:   DVT: Heparin   GI: H2 blocker   Analgesia: None   Nutrition: Tube Feeds   Therapy: PT OT eval ordered   Sedation: none     Family communication/Prognosis:   Family members updated about patient's current condition and prognosis  at bedside 7/31   Prognosis is Poor.    Jon Boninerevor Aldred, MD   - 12/19/2018   Jon Boninerevor Aldred, MD  Family Medicine, PGY 1  Pager # [phone]      ------------------------------------------------------------------------------------------------------------------------------------------------  I saw and examined the patient 12/19/2018. I reviewed Dr.Aldred's note. I agree with the findings and plan of care as documented in the fellow's/resident's note. Any exceptions/additions are noted above.     Jon ScottSunil Donte Lenzo MD  Pulmonary, Critical Care and Sleep Medicine  12/19/2018

## 2018-12-19 NOTE — Care Plan (Signed)
1/1/1 no protectives, cooling blankets continued and PRN gastric and rectal tylenol given for fever.     plan: OR 8/3 for CORE donations.

## 2018-12-19 NOTE — Care Plan (Addendum)
Patient GCS 1-1-1, tube feedings adjusted to 23ml/hr with goal of 60 ml/hr, increase by 10 q6 with q40 240mL flushes. Changed from CPAP to [facility]. Plan is to wait on family to come see him and proceed with CORE Sunday into Monday.

## 2018-12-20 ENCOUNTER — Inpatient Hospital Stay (HOSPITAL_COMMUNITY): Payer: Self-pay

## 2018-12-20 DIAGNOSIS — J982 Interstitial emphysema: Secondary | ICD-10-CM

## 2018-12-20 DIAGNOSIS — Z978 Presence of other specified devices: Secondary | ICD-10-CM

## 2018-12-20 LAB — CBC WITH DIFF
BASOPHIL #: <0.1 10*3/uL (ref <=0.20)
BASOPHIL %: 0 %
EOSINOPHIL #: <0.1 10*3/uL (ref <=0.50)
EOSINOPHIL %: 0 %
HCT: 38.1 % — ABNORMAL LOW (ref 38.9–52.0)
HGB: 11.7 g/dL — ABNORMAL LOW (ref 13.4–17.5)
IMMATURE GRANULOCYTE #: 0.16 10*3/uL — ABNORMAL HIGH (ref <0.10)
IMMATURE GRANULOCYTE %: 1 % (ref 0–1)
LYMPHOCYTE #: 0.7 10*3/uL — ABNORMAL LOW (ref 1.00–4.80)
LYMPHOCYTE %: 4 %
MCH: 30.7 pg (ref 26.0–32.0)
MCHC: 30.7 g/dL — ABNORMAL LOW (ref 31.0–35.5)
MCV: 100 fL (ref 78.0–100.0)
MONOCYTE #: 0.96 10*3/uL (ref 0.20–1.10)
MONOCYTE %: 6 %
MPV: 10 fL (ref 8.7–12.5)
NEUTROPHIL #: 13.98 10*3/uL — ABNORMAL HIGH (ref 1.50–7.70)
NEUTROPHIL %: 89 %
PLATELETS: 167 10*3/uL (ref 150–400)
RBC: 3.81 10*6/uL — ABNORMAL LOW (ref 4.50–6.10)
RDW-CV: 14.7 % (ref 11.5–15.5)
WBC: 15.8 10*3/uL — ABNORMAL HIGH (ref 3.7–11.0)

## 2018-12-20 LAB — URINALYSIS, MACROSCOPIC
BILIRUBIN: NEGATIVE mg/dL
COLOR: NORMAL
GLUCOSE: 150 mg/dL — AB
KETONES: NEGATIVE mg/dL
LEUKOCYTES: NEGATIVE WBCs/uL
NITRITE: NEGATIVE
PH: 6 (ref 5.0–8.0)
PROTEIN: NEGATIVE mg/dL
SPECIFIC GRAVITY: 1.026 (ref 1.005–1.030)
UROBILINOGEN: NEGATIVE mg/dL

## 2018-12-20 LAB — ARTERIAL BLOOD GAS/LACTATE
%FIO2 (ARTERIAL): 75 %
BASE DEFICIT: 1.6 mmol/L (ref 0.0–3.0)
BICARBONATE (ARTERIAL): 23.6 mmol/L (ref 18.0–26.0)
LACTATE: 1.2 mmol/L (ref 0.0–1.3)
PAO2/FIO2 RATIO: 105 (ref <=200)
PCO2 (ARTERIAL): 70 mmHg (ref 35.0–45.0)
PH (ARTERIAL): 7.21 — CL (ref 7.35–7.45)
PO2 (ARTERIAL): 79 mmHg (ref 72.0–100.0)

## 2018-12-20 LAB — BASIC METABOLIC PANEL
ANION GAP: 7 mmol/L (ref 4–13)
ANION GAP: 10 mmol/L (ref 4–13)
BUN/CREA RATIO: 40 — ABNORMAL HIGH (ref 6–22)
BUN/CREA RATIO: 34 — ABNORMAL HIGH (ref 6–22)
BUN: 37 mg/dL — ABNORMAL HIGH (ref 8–25)
BUN: 45 mg/dL — ABNORMAL HIGH (ref 8–25)
CALCIUM: 7.3 mg/dL — ABNORMAL LOW (ref 8.5–10.2)
CALCIUM: 9.1 mg/dL (ref 8.5–10.2)
CHLORIDE: 117 mmol/L — ABNORMAL HIGH (ref 96–111)
CHLORIDE: 110 mmol/L (ref 96–111)
CO2 TOTAL: 23 mmol/L (ref 22–32)
CO2 TOTAL: 27 mmol/L (ref 22–32)
CREATININE: 0.93 mg/dL (ref 0.62–1.27)
CREATININE: 1.31 mg/dL — ABNORMAL HIGH (ref 0.62–1.27)
ESTIMATED GFR: >60 mL/min/{1.73_m2} (ref >60)
ESTIMATED GFR: >60 mL/min/{1.73_m2} (ref >60)
GLUCOSE: 153 mg/dL — ABNORMAL HIGH (ref 65–139)
GLUCOSE: 227 mg/dL — ABNORMAL HIGH (ref 65–139)
POTASSIUM: 3.2 mmol/L — ABNORMAL LOW (ref 3.5–5.1)
POTASSIUM: 4 mmol/L (ref 3.5–5.1)
SODIUM: 147 mmol/L — ABNORMAL HIGH (ref 136–145)
SODIUM: 147 mmol/L — ABNORMAL HIGH (ref 136–145)

## 2018-12-20 LAB — ARTERIAL BLOOD GAS - MLS OP ONLY
BASE EXCESS (ARTERIAL): 2.4 mmol/L — ABNORMAL HIGH (ref 0.0–1.0)
BASE EXCESS (ARTERIAL): 5.3 mmol/L — ABNORMAL HIGH (ref 0.0–1.0)
BASE EXCESS (ARTERIAL): 7.3 mmol/L — ABNORMAL HIGH (ref 0.0–1.0)
BICARBONATE (ARTERIAL): 26.9 mmol/L — ABNORMAL HIGH (ref 18.0–26.0)
BICARBONATE (ARTERIAL): 29 mmol/L — ABNORMAL HIGH (ref 18.0–26.0)
BICARBONATE (ARTERIAL): 30.7 mmol/L — ABNORMAL HIGH (ref 18.0–26.0)
PCO2 (ARTERIAL): 62 mmHg (ref 35.0–45.0)
PCO2 (ARTERIAL): 46 mmHg — ABNORMAL HIGH (ref 35.0–45.0)
PCO2 (ARTERIAL): 53 mmHg — ABNORMAL HIGH (ref 35.0–45.0)
PH (ARTERIAL): 7.3 — ABNORMAL LOW (ref 7.35–7.45)
PH (ARTERIAL): 7.43 (ref 7.35–7.45)
PH (ARTERIAL): 7.41 (ref 7.35–7.45)
PO2 (ARTERIAL): 283 mmHg — ABNORMAL HIGH (ref 72.0–100.0)
PO2 (ARTERIAL): 70 mmHg — ABNORMAL LOW (ref 72.0–100.0)
PO2 (ARTERIAL): 222 mmHg — ABNORMAL HIGH (ref 72.0–100.0)

## 2018-12-20 LAB — ARTERIAL BLOOD GAS (TEMP COMP)
%FIO2 (ARTERIAL): 75 %
(T) PCO2: 62 mmHg (ref 35.0–45.0)
(T) PO2: 76 mmHg (ref 72.0–100.0)
BASE DEFICIT: 0.3 mmol/L (ref 0.0–3.0)
BICARBONATE (ARTERIAL): 24.6 mmol/L (ref 18.0–26.0)
PAO2/FIO2 RATIO: 99 (ref <=200)
PCO2 (ARTERIAL): 61 mmHg (ref 35.0–45.0)
PH (ARTERIAL): 7.27 — ABNORMAL LOW (ref 7.35–7.45)
PH (T): 7.27 — ABNORMAL LOW (ref 7.35–7.45)
PO2 (ARTERIAL): 74 mmHg (ref 72.0–100.0)
TEMPERATURE, COMP: 37.3 C (ref 15.0–40.0)

## 2018-12-20 LAB — BRONCHOALVEOLAR LAVAGE (BAL) QUANTITATIVE CULTURE/GRAM STAIN
BRONCHOALVEOLAR LAVAGE (BAL) QUANTITATIVE CULTURE: 8000
GRAM STAIN: NONE SEEN

## 2018-12-20 LAB — TOTAL PROTEIN: PROTEIN TOTAL: 6.2 g/dL — ABNORMAL LOW (ref 6.4–8.3)

## 2018-12-20 LAB — POC BLOOD GLUCOSE (RESULTS)
GLUCOSE, POC: 148 mg/dL — ABNORMAL HIGH (ref 70–105)
GLUCOSE, POC: 151 mg/dL — ABNORMAL HIGH (ref 70–105)
GLUCOSE, POC: 160 mg/dL — ABNORMAL HIGH (ref 70–105)
GLUCOSE, POC: 175 mg/dL — ABNORMAL HIGH (ref 70–105)
GLUCOSE, POC: 195 mg/dL — ABNORMAL HIGH (ref 70–105)
GLUCOSE, POC: 202 mg/dL — ABNORMAL HIGH (ref 70–105)
GLUCOSE, POC: 216 mg/dL — ABNORMAL HIGH (ref 70–105)

## 2018-12-20 LAB — ARTERIAL BLOOD GAS
%FIO2 (ARTERIAL): 100 %
%FIO2 (ARTERIAL): 65 %
%FIO2 (ARTERIAL): 100 %
PAO2/FIO2 RATIO: 283 (ref <=200)
PAO2/FIO2 RATIO: 108 (ref <=200)
PAO2/FIO2 RATIO: 222 (ref <=200)

## 2018-12-20 LAB — LIPASE: LIPASE: 72 U/L (ref 10–80)

## 2018-12-20 LAB — BILIRUBIN, CONJUGATED (DIRECT): BILIRUBIN DIRECT: 0.1 mg/dL (ref <0.3)

## 2018-12-20 LAB — ALK PHOS (ALKALINE PHOSPHATASE): ALKALINE PHOSPHATASE: 64 U/L (ref 45–115)

## 2018-12-20 LAB — ALT (SGPT): ALT (SGPT): 55 U/L — ABNORMAL HIGH (ref <55)

## 2018-12-20 LAB — URINALYSIS, MICROSCOPIC
HYALINE CASTS: 54 /LPF — ABNORMAL HIGH (ref <4.0)
RBCS: 100 /HPF — ABNORMAL HIGH (ref <6.0)
WBCS: 5 /HPF — ABNORMAL HIGH (ref <4.0)

## 2018-12-20 LAB — GAMMA GT: GGT: 95 U/L — ABNORMAL HIGH (ref 7–50)

## 2018-12-20 LAB — PT/INR
INR: 1.27 — ABNORMAL HIGH (ref 0.80–1.20)
PROTHROMBIN TIME: 14.7 s — ABNORMAL HIGH (ref 9.1–13.9)

## 2018-12-20 LAB — BILIRUBIN TOTAL: BILIRUBIN TOTAL: 0.4 mg/dL (ref 0.3–1.3)

## 2018-12-20 LAB — AST (SGOT): AST (SGOT): 79 U/L — ABNORMAL HIGH (ref 8–48)

## 2018-12-20 LAB — MAGNESIUM: MAGNESIUM: 2 mg/dL (ref 1.6–2.6)

## 2018-12-20 LAB — TROPONIN-I: TROPONIN I: 1695 ng/L (ref 0–30)

## 2018-12-20 LAB — CREATINE KINASE (CK), TOTAL, SERUM OR PLASMA: CREATINE KINASE: 751 U/L — ABNORMAL HIGH (ref 45–225)

## 2018-12-20 LAB — AMYLASE: AMYLASE: 60 U/L (ref 25–125)

## 2018-12-20 LAB — PHOSPHORUS: PHOSPHORUS: 3.2 mg/dL (ref 2.4–4.7)

## 2018-12-20 LAB — PTT (PARTIAL THROMBOPLASTIN TIME): APTT: 23.6 s — ABNORMAL LOW (ref 24.2–37.5)

## 2018-12-20 MED ORDER — POTASSIUM BICARBONATE-CITRIC ACID 20 MEQ EFFERVESCENT TABLET
40.0000 meq | EFFERVESCENT_TABLET | ORAL | Status: AC
Start: 2018-12-20 — End: 2018-12-20
  Administered 2018-12-20: 08:00:00 40 meq via GASTROSTOMY
  Filled 2018-12-20: qty 2

## 2018-12-20 MED ORDER — PEG 400-HYPROMELLOSE-GLYCERIN 1 %-0.2 %-0.2 % EYE DROPS
1.0000 [drp] | OPHTHALMIC | Status: DC | PRN
Start: 2018-12-20 — End: 2018-12-22
  Administered 2018-12-20: 1 [drp] via OPHTHALMIC
  Filled 2018-12-20: qty 15

## 2018-12-20 NOTE — Progress Notes (Signed)
Medical Intensive Care Unit  PROGRESS NOTE     Name: Jon Parrish, Jon Parrish., 38 y.o. male Date of service: 12/20/2018   Room: 06/A  LOS: 5    MRN: [id number] Attending: Claudie Revering, MD   Code Status: Full Code Admitted to MICU for:  Severe Anoxic Encephalopathy     SUBJECTIVE  INTERVAL EVENTS:   Jon Parrish. is a 37 y.o. 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  Currently awaiting OR with CORE. Per CORE today 8/1 family updated and would like an OR date of 9am Monday morning 8/3; CORE working towards that goal with recipient families.    Daily updates: NAEON. GCS 1/1/1 and with no protective reflexes. Patient intubated, on vent. Patient febrile ON, on multiple ABX and high dose steroids, cooling blanket, tylenol. BAL returned with no organism; 2+ PMNs.    OBJECTIVE:    VITAL SIGNS:  Temperature: 37.3 C (99.1 F) BP (Non-Invasive): (!) 139/93 Heart Rate: 97   Respiratory Rate: 18 SpO2: 95 % Weight: 77.5 kg (170 lb 13.7 oz)        INTAKE & OUTPUT:    Intake/Output Summary (Last 24 hours) at 12/20/2018 0092  Last data filed at 12/20/2018 0700  Gross per 24 hour   Intake 3717 ml   Output 1900 ml   Net 1817 ml     Last Bowel Movement: 12/16/18      VENTILATOR SETTINGS:  Conventional settings:  Set VT: 400 mL  Set Rate: 18 Breaths Per Minute  Set PEEP: 8 cmH2O  Pressure Support: 10 cmH2O  FiO2: 75 %     LINES & DRAINS:   Patient Lines/Drains/Airways Status    Active Line / Dialysis Catheter / Dialysis Graft / Drain / Airway / Wound     Name: Placement date: Placement time: Site: Days:    Peripheral IV Right Median Cubital  (antecubital fossa)  --   --       Peripheral IV Left Forearm  12/11/2018   0637   5    Peripheral IV Right;Lower Forearm  11/20/2018   0646   5    Peripheral IV Ultrasound guided;Extended dwell catheter Left Forearm  12/16/18   1404   3    Central Triple Lumen Right Internal Jugular  12/17/18    1816   2    Arterial Line Left Radial  12/17/18   1818   Radial  2    Oral Gastric Tube Center  --   --       Foley Catheter  12/13/2018   3300   5    EndoTracheal Tube Oral 7.5 23 cm Lip  12/12/2018   0500   5                  DAILY EXAMINATION:  Physical Exam:  Constitutional: acutely ill, no distress. Intubated  Eyes: Conjunctiva clear., Pupils equal and round. , abnormal findings: pupils non-reactive and pinpoint  ENT: Mouth mucous membranes moist.   Neck: supple, symmetrical, trachea midline  Respiratory: Clear to auscultation bilaterally. , no wheezes. Intubated and mechanically ventilated.  Cardiovascular: regular rate and rhythm, no murmur. No peripheral edema.  Gastrointestinal: Soft, non-tender, non-distended  Genitourinary: Foley in place  Musculoskeletal: Head atraumatic and normocephalic.   Integumentary:  Skin warm and dry. Bilateral lower extremity excoriations. Needle track marks upper extremities.       LABS  IMAGING  MICROBIOLOGY  PATHOLOGY:     I have reviewed all of the recent labs, imaging, microbiology and pathology.    ASSESSMENT:     Jon Parrish. is a 38 y.o. 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      Daily Updates - No significant changes. CORE involved. OR 8/3 planned     PLAN:    Neuro   Profound Hypoxic Ischemic Encephalopathy following Cardiac arrest 2/2 Opioid OD.                               With resulting Hypoxic hypercarbic respiratory failure                 -Patient GCS 3 with loss of corneal, gag, cough reflexes                 - Secondary to IVDU and overdose with Utox positive for multiple substances                 - Brain MRI showed findings of extensive hypoxic ischemic encephalopathy.                  -Management per/with CORE                      -Bronchoscopy completed and BAL resulted 8/1 with no organisms and 2+ PMNs                 - Daily ABG,  BMP, Mg, Phos     Cardio   Troponin peak 13,522; trended down last 4364 on 7/30   NTD at this time     Pulm     Hypoxic hypercarbic respiratory failure 2/2 severe hypoxic encephalopathy    - Pt on CPAP 75% FiO2    - Daily ABG's pH 7.27 today    - Otherwise Mgmt per CORE     GI   Tube feeds, 75m/hr, goal rate 60   Famotidine   Lactulose   Robinul ordered for secretions     Renal   AKI, Cr 1.42 previously; today 8/1 0.93   LR at 574mhr   Hypokalemia 3.2 on 8/1; replacement with 40 mEq Effer-K   Monitor with daily BMP     Endo   Pt on low dose sliding scale lispro     MSK/Skin   NTD     Infectious Disease    #Fever                  -Related to Neurologic injury / post-cardiac arrest care vs. Infectious.                  -WBC 15.8 today 8/1                  -Tylenol PRN                  -Hypo/Hyperthermia blanket      Heme/Onc   Hgb 11.7/38.1 on 8/1   Will monitor with daily CBC     Prophylaxis:   DVT: Heparin   GI: H2 blocker   Analgesia: None   Nutrition: Tube Feeds   Therapy: PT OT eval ordered   Sedation: none     Family communication/Prognosis:   Family members updated about patient's current  condition and prognosis at bedside 7/31   Prognosis is Poor.    Jamal Collin, DO  12/20/2018, 07:20  [facility], PGY-4     ------------------------------------------------------------------------------------------------------------------------------------------------  I saw and examined the patient 12/20/2018. I reviewed Dr.Mahady's note. I agree with the findings and plan of care as documented in the fellow's/resident's note. Any exceptions/additions are noted above.     Claudie Revering MD  Pulmonary, Critical Care and Sleep Medicine  12/20/2018

## 2018-12-20 NOTE — Care Plan (Signed)
VENTILATOR - CPAP(PS) / SPONTANEOUS CONTINUOUS    Discontinue   Duration: Until Specified    Priority: Routine       Question Answer Comment   FIO2 (%) 75    Peep(cm/H2O) 8    Pressure Support(cm/H2O) 10    Indications IMPROVE DISTRIBUTION OF VENTILATION    Invasive/Non-Invasive INVASIVE            Pt on the above settings and tolerating well. Possible core pt. Lung recruitment maneuvers Q6h. Will continue to monitor.

## 2018-12-20 NOTE — Care Plan (Signed)
Problem: Adult Inpatient Plan of Care  Goal: Plan of Care Review  Outcome: Ongoing (see interventions/notes)    Patient GCS remains 1-1-1 and no protectives. Labs drawn for CORE today. Will need COVID swab in AM for preparations to go to OR on 8/3 at 0900.

## 2018-12-20 DEATH — deceased

## 2018-12-21 ENCOUNTER — Inpatient Hospital Stay (HOSPITAL_COMMUNITY): Payer: Self-pay

## 2018-12-21 ENCOUNTER — Encounter (HOSPITAL_COMMUNITY): Payer: Self-pay | Admitting: Specialist

## 2018-12-21 DIAGNOSIS — Z4682 Encounter for fitting and adjustment of non-vascular catheter: Secondary | ICD-10-CM

## 2018-12-21 DIAGNOSIS — I340 Nonrheumatic mitral (valve) insufficiency: Secondary | ICD-10-CM

## 2018-12-21 DIAGNOSIS — Z978 Presence of other specified devices: Secondary | ICD-10-CM

## 2018-12-21 DIAGNOSIS — J982 Interstitial emphysema: Secondary | ICD-10-CM

## 2018-12-21 DIAGNOSIS — I38 Endocarditis, valve unspecified: Secondary | ICD-10-CM

## 2018-12-21 LAB — BASIC METABOLIC PANEL
ANION GAP: 7 mmol/L (ref 4–13)
BUN/CREA RATIO: 41 — ABNORMAL HIGH (ref 6–22)
BUN: 50 mg/dL — ABNORMAL HIGH (ref 8–25)
CALCIUM: 9 mg/dL (ref 8.5–10.2)
CHLORIDE: 109 mmol/L (ref 96–111)
CO2 TOTAL: 30 mmol/L (ref 22–32)
CREATININE: 1.22 mg/dL (ref 0.62–1.27)
ESTIMATED GFR: >60 mL/min/{1.73_m2} (ref >60)
GLUCOSE: 210 mg/dL — ABNORMAL HIGH (ref 65–139)
POTASSIUM: 3.9 mmol/L (ref 3.5–5.1)
SODIUM: 146 mmol/L — ABNORMAL HIGH (ref 136–145)

## 2018-12-21 LAB — ARTERIAL BLOOD GAS - MLS OP ONLY
BASE EXCESS (ARTERIAL): 8 mmol/L — ABNORMAL HIGH (ref 0.0–1.0)
BASE EXCESS (ARTERIAL): 10.1 mmol/L — ABNORMAL HIGH (ref 0.0–1.0)
BASE EXCESS (ARTERIAL): 7.1 mmol/L — ABNORMAL HIGH (ref 0.0–1.0)
BICARBONATE (ARTERIAL): 31.2 mmol/L — ABNORMAL HIGH (ref 18.0–26.0)
BICARBONATE (ARTERIAL): 32.9 mmol/L — ABNORMAL HIGH (ref 18.0–26.0)
BICARBONATE (ARTERIAL): 30.5 mmol/L — ABNORMAL HIGH (ref 18.0–26.0)
PCO2 (ARTERIAL): 45 mmHg (ref 35.0–45.0)
PCO2 (ARTERIAL): 43 mmHg (ref 35.0–45.0)
PCO2 (ARTERIAL): 45 mmHg (ref 35.0–45.0)
PH (ARTERIAL): 7.47 — ABNORMAL HIGH (ref 7.35–7.45)
PH (ARTERIAL): 7.51 — ABNORMAL HIGH (ref 7.35–7.45)
PH (ARTERIAL): 7.46 — ABNORMAL HIGH (ref 7.35–7.45)
PO2 (ARTERIAL): 203 mmHg — ABNORMAL HIGH (ref 72.0–100.0)
PO2 (ARTERIAL): 163 mmHg — ABNORMAL HIGH (ref 72.0–100.0)
PO2 (ARTERIAL): 319 mmHg — ABNORMAL HIGH (ref 72.0–100.0)

## 2018-12-21 LAB — ARTERIAL BLOOD GAS/LACTATE
%FIO2 (ARTERIAL): 100 %
%FIO2 (ARTERIAL): 90 %
BASE EXCESS (ARTERIAL): 6.6 mmol/L — ABNORMAL HIGH (ref 0.0–1.0)
BASE EXCESS (ARTERIAL): 7 mmol/L — ABNORMAL HIGH (ref 0.0–1.0)
BICARBONATE (ARTERIAL): 30.1 mmol/L — ABNORMAL HIGH (ref 18.0–26.0)
BICARBONATE (ARTERIAL): 30.4 mmol/L — ABNORMAL HIGH (ref 18.0–26.0)
LACTATE: 1.8 mmol/L — ABNORMAL HIGH (ref 0.0–1.3)
LACTATE: 1.5 mmol/L — ABNORMAL HIGH (ref 0.0–1.3)
PAO2/FIO2 RATIO: 181 (ref <=200)
PAO2/FIO2 RATIO: 84 (ref <=200)
PCO2 (ARTERIAL): 50 mmHg — ABNORMAL HIGH (ref 35.0–45.0)
PCO2 (ARTERIAL): 42 mmHg (ref 35.0–45.0)
PH (ARTERIAL): 7.42 (ref 7.35–7.45)
PH (ARTERIAL): 7.48 — ABNORMAL HIGH (ref 7.35–7.45)
PO2 (ARTERIAL): 181 mmHg — ABNORMAL HIGH (ref 72.0–100.0)
PO2 (ARTERIAL): 76 mmHg (ref 72.0–100.0)

## 2018-12-21 LAB — ARTERIAL BLOOD GAS
%FIO2 (ARTERIAL): 100 %
%FIO2 (ARTERIAL): 100 %
%FIO2 (ARTERIAL): 100 %
PAO2/FIO2 RATIO: 203 (ref <=200)
PAO2/FIO2 RATIO: 163 (ref <=200)
PAO2/FIO2 RATIO: 319 (ref <=200)

## 2018-12-21 LAB — CBC WITH DIFF
BASOPHIL #: <0.1 10*3/uL (ref <=0.20)
BASOPHIL %: 1 %
EOSINOPHIL #: <0.1 10*3/uL (ref <=0.50)
EOSINOPHIL %: 1 %
HCT: 38.9 % (ref 38.9–52.0)
HGB: 12.4 g/dL — ABNORMAL LOW (ref 13.4–17.5)
IMMATURE GRANULOCYTE #: 0.32 10*3/uL — ABNORMAL HIGH (ref <0.10)
IMMATURE GRANULOCYTE %: 2 % — ABNORMAL HIGH (ref 0–1)
LYMPHOCYTE #: 0.53 10*3/uL — ABNORMAL LOW (ref 1.00–4.80)
LYMPHOCYTE %: 4 %
MCH: 30.4 pg (ref 26.0–32.0)
MCHC: 31.9 g/dL (ref 31.0–35.5)
MCV: 95.3 fL (ref 78.0–100.0)
MONOCYTE #: 1.18 10*3/uL — ABNORMAL HIGH (ref 0.20–1.10)
MONOCYTE %: 9 %
MPV: 10.2 fL (ref 8.7–12.5)
NEUTROPHIL #: 11.08 10*3/uL — ABNORMAL HIGH (ref 1.50–7.70)
NEUTROPHIL %: 83 %
PLATELETS: 172 10*3/uL (ref 150–400)
RBC: 4.08 10*6/uL — ABNORMAL LOW (ref 4.50–6.10)
RDW-CV: 14.1 % (ref 11.5–15.5)
WBC: 13.3 10*3/uL — ABNORMAL HIGH (ref 3.7–11.0)

## 2018-12-21 LAB — POC BLOOD GLUCOSE (RESULTS)
GLUCOSE, POC: 187 mg/dL — ABNORMAL HIGH (ref 70–105)
GLUCOSE, POC: 168 mg/dL — ABNORMAL HIGH (ref 70–105)
GLUCOSE, POC: 175 mg/dL — ABNORMAL HIGH (ref 70–105)
GLUCOSE, POC: 154 mg/dL — ABNORMAL HIGH (ref 70–105)
GLUCOSE, POC: 185 mg/dL — ABNORMAL HIGH (ref 70–105)

## 2018-12-21 LAB — PT/INR
INR: 1.28 — ABNORMAL HIGH (ref 0.80–1.20)
PROTHROMBIN TIME: 14.8 s — ABNORMAL HIGH (ref 9.1–13.9)

## 2018-12-21 LAB — HEPATIC FUNCTION PANEL
ALBUMIN: 2.9 g/dL — ABNORMAL LOW (ref 3.5–5.0)
ALKALINE PHOSPHATASE: 55 U/L (ref 45–115)
ALT (SGPT): 44 U/L (ref <55)
AST (SGOT): 60 U/L — ABNORMAL HIGH (ref 8–48)
BILIRUBIN DIRECT: 0.2 mg/dL (ref <0.3)
BILIRUBIN TOTAL: 0.6 mg/dL (ref 0.3–1.3)
PROTEIN TOTAL: 6 g/dL — ABNORMAL LOW (ref 6.4–8.3)

## 2018-12-21 LAB — MAGNESIUM: MAGNESIUM: 2.3 mg/dL (ref 1.6–2.6)

## 2018-12-21 LAB — PHOSPHORUS: PHOSPHORUS: 1.6 mg/dL — ABNORMAL LOW (ref 2.4–4.7)

## 2018-12-21 LAB — COVID-19 ~~LOC~~ MOLECULAR LAB TESTING: SARS-CoV-2: NOT DETECTED

## 2018-12-21 LAB — PTT (PARTIAL THROMBOPLASTIN TIME): APTT: 23.3 s — ABNORMAL LOW (ref 24.2–37.5)

## 2018-12-21 NOTE — Care Plan (Signed)
VENTILATOR - SIMV(PRVC)PS / APV-SIMV CONTINUOUS    Discontinue   Duration: Until Specified    Priority: Routine       Question Answer Comment   FIO2 (%) 75    Peep(cm/H2O) 5    Rate(bpm) 18    Tidal Volume(mls) 550    Pressure Support(cm/H2O) 10    Indications IMPROVE DISTRIBUTION OF VENTILATION         Pt on the above settings. Rate changed per CORE to correct pt's Co2. Increased pt's fio2 t/o night. MICU and CORE made aware. Goal is to keep Spo2 around 93%. LRM Q3h with vent checks. Will continue to monitor.

## 2018-12-21 NOTE — Care Plan (Signed)
VENTILATOR - SIMV(PRVC)PS / APV-SIMV CONTINUOUS    Discontinue   Duration: Until Specified    Priority: Routine       Question Answer Comment   FIO2 (%) 80    Peep(cm/H2O) 12    Rate(bpm) 18    Tidal Volume(mls) 550    Pressure Support(cm/H2O) 10    Indications IMPROVE DISTRIBUTION OF VENTILATION              Patient remains on settings shown above. LRM continued Q3 and is tolerating well. Suctions for minimal secretions. Planning for OR per CORE tomorrow at [place].

## 2018-12-21 NOTE — Progress Notes (Signed)
**Note Jon-Identified via Obfuscation** Medical Intensive Care Unit  PROGRESS NOTE     Name: Jon Parrish, Hu., 38 y.o. male Date of service: 12/21/2018   Room: 06/A  LOS: 6    MRN: [id number] Attending: Claudie Revering, MD   Code Status: Full Code Admitted to MICU for:  Severe Anoxic Encephalopathy     SUBJECTIVE  INTERVAL EVENTS:   Jon Parrish. is a 38 y.o. 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  Currently awaiting OR with CORE. Per CORE today 8/1 family updated and would like an OR date of 9am Monday morning 8/3; CORE working towards that goal with recipient families.    Daily updates: Over night respiratory rate was increased from 16 to 18 for CO2 of 53 as per CORE. GCS 1/1/1 and with no protective reflexes. Patient remains intubated, on vent. Patient febrile ON, requiring tylenol,  on multiple ABX and high dose steroids, cooling blanket. BAL still no growth.    OBJECTIVE:    VITAL SIGNS:  Temperature: 37.6 C (99.7 F) BP (Non-Invasive): (!) 150/90 Heart Rate: (!) 114   Respiratory Rate: (!) 0 SpO2: 91 % Weight: 77.5 kg (170 lb 13.7 oz)        INTAKE & OUTPUT:    Intake/Output Summary (Last 24 hours) at 12/21/2018 1148  Last data filed at 12/21/2018 1100  Gross per 24 hour   Intake 3834 ml   Output 2130 ml   Net 1704 ml     Last Bowel Movement: 12/21/18      VENTILATOR SETTINGS:  Conventional settings:  Set VT: 550 mL  Set Rate: 18 Breaths Per Minute  Set PEEP: 8 cmH2O  Pressure Support: 10 cmH2O  FiO2: 80 %     LINES & DRAINS:   Patient Lines/Drains/Airways Status    Active Line / Dialysis Catheter / Dialysis Graft / Drain / Airway / Wound     Name: Placement date: Placement time: Site: Days:    Peripheral IV Left Forearm  11/27/2018   0637   6    Peripheral IV Right;Lower Forearm  12/14/2018   0646   6    Peripheral IV Ultrasound guided;Extended dwell catheter Left Forearm  12/16/18   1404   4    Central Triple Lumen Right Internal  Jugular  12/17/18   1816   3    Arterial Line Left Radial  12/17/18   1818   Radial  3    Foley Catheter  12/08/2018   0638   6    Bowel Management System Fecal Incontinence System  12/20/18   1709   less than 1    EndoTracheal Tube Oral 7.5 23 cm Lip  12/16/2018   0500   6                  DAILY EXAMINATION:  Physical Exam:  No change in physical exam since yesterday.  Constitutional: acutely ill, no distress. Intubated  Eyes: Conjunctiva clear., Pupils equal and round. , abnormal findings: pupils non-reactive and pinpoint  ENT: Mouth mucous membranes moist.   Neck: supple, symmetrical, trachea midline  Respiratory: Clear to auscultation bilaterally. , no wheezes. Intubated and mechanically ventilated.  Cardiovascular: regular rate and rhythm, no murmur. No peripheral edema.  Gastrointestinal:  The Soft, non-tender, non-distended  Genitourinary: Foley in place  Musculoskeletal: Head atraumatic and normocephalic.   Integumentary:  Skin warm and dry. Bilateral lower extremity  excoriations. Needle track marks upper extremities.       LABS  IMAGING  MICROBIOLOGY  PATHOLOGY:     I have reviewed all of the recent labs, imaging, microbiology and pathology.    ASSESSMENT:     Jon Parrish. is a 38 y.o. 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      Daily Updates - No significant changes. CORE involved. OR 8/3 planned, TEE, urinalysis, COVID-19 before surgery on 8/3 09:00 AM.     PLAN:    Neuro   Profound Hypoxic Ischemic Encephalopathy following Cardiac arrest 2/2 Opioid OD.                               With resulting Hypoxic hypercarbic respiratory failure                 -Patient GCS 3 with loss of corneal, gag, cough reflexes                 - Secondary to IVDU and overdose with Utox positive for multiple substances                 - Brain MRI showed findings of extensive hypoxic ischemic  encephalopathy.                  -Management per/with CORE                      -Bronchoscopy completed and BAL resulted 8/1 with no organisms and 2+ PMNs, no growth till date.                 - Daily ABG, BMP, Mg, Phos     Cardio   Troponin peak 13,522; trended down last 4364 on 7/30   Will get TEE today as per CORE.   NTD at this time     Pulm     Hypoxic hypercarbic respiratory failure 2/2 severe hypoxic encephalopathy    - Pt on SIMV 80% FiO2    - Daily ABG's pH 7.49 today    - Otherwise Mgmt per CORE     GI   Tube feeds, 44m/hr, goal rate 60   Famotidine   Lactulose   Robinul ordered for secretions     Renal   AKI, Cr 1.42 previously; today 8/2, 1.22   LR at 555mhr   Hypokalemia 3.9 on 8/2   Monitor with daily BMP     Endo   Pt on low dose sliding scale lispro, required 20 units in the past 24 hours     MSK/Skin   NTD     Infectious Disease    #Fever                  -Related to Neurologic injury / post-cardiac arrest care vs. Infectious.                  -WBC 13.3 today 8/2                  -Tylenol PRN                  -Hypo/Hyperthermia blanket                                        -  Continue Vancomycin and Cefepime.      Heme/Onc   Hgb 12.4 on 8/1   Will monitor with daily CBC     Prophylaxis:   DVT: Heparin   GI: H2 blocker   Analgesia: None   Nutrition: Tube Feeds   Therapy: PT OT eval ordered   Sedation: none     Family communication/Prognosis:   Family members updated about patient's current condition and prognosis at bedside 7/31   Prognosis is Poor and decision is made for CMO and currently awaiting OR with CORE.    Jon Nurse, MD  Internal Medicine/Pediatrics  [facility]    ------------------------------------------------------------------------------------------------------------------------------------------------  I saw and examined the patient 12/21/2018. I reviewed Jon Parrish's note. I agree with the findings and plan of care as documented in the  fellow's/resident's note. Any exceptions/additions are noted above.     Jon Revering MD  Pulmonary, Critical Care and Sleep Medicine  01/01/2019

## 2018-12-22 ENCOUNTER — Encounter (HOSPITAL_COMMUNITY): Admission: AD | Disposition: E | Payer: Self-pay | Source: Other Acute Inpatient Hospital | Attending: Internal Medicine

## 2018-12-22 LAB — URINALYSIS, MICROSCOPIC
HYALINE CASTS: 28 /LPF — ABNORMAL HIGH (ref <4.0)
RBCS: 29 /HPF — ABNORMAL HIGH (ref <6.0)
WBCS: 1 /HPF (ref <4.0)

## 2018-12-22 LAB — CBC WITH DIFF
BASOPHIL #: <0.1 10*3/uL (ref <=0.20)
BASOPHIL %: 1 %
EOSINOPHIL #: <0.1 10*3/uL (ref <=0.50)
EOSINOPHIL %: 0 %
HCT: 39.6 % (ref 38.9–52.0)
HGB: 12.7 g/dL — ABNORMAL LOW (ref 13.4–17.5)
IMMATURE GRANULOCYTE #: 0.27 10*3/uL — ABNORMAL HIGH (ref <0.10)
IMMATURE GRANULOCYTE %: 2 % — ABNORMAL HIGH (ref 0–1)
LYMPHOCYTE #: 0.46 10*3/uL — ABNORMAL LOW (ref 1.00–4.80)
LYMPHOCYTE %: 3 %
MCH: 30.7 pg (ref 26.0–32.0)
MCHC: 32.1 g/dL (ref 31.0–35.5)
MCV: 95.7 fL (ref 78.0–100.0)
MONOCYTE #: 1.66 10*3/uL — ABNORMAL HIGH (ref 0.20–1.10)
MONOCYTE %: 9 %
MPV: 9.9 fL (ref 8.7–12.5)
NEUTROPHIL #: 15.78 10*3/uL — ABNORMAL HIGH (ref 1.50–7.70)
NEUTROPHIL %: 85 %
PLATELETS: 194 10*3/uL (ref 150–400)
RBC: 4.14 10*6/uL — ABNORMAL LOW (ref 4.50–6.10)
RDW-CV: 14.1 % (ref 11.5–15.5)
WBC: 18.3 10*3/uL — ABNORMAL HIGH (ref 3.7–11.0)

## 2018-12-22 LAB — HEPATIC FUNCTION PANEL
ALBUMIN: 2.9 g/dL — ABNORMAL LOW (ref 3.5–5.0)
ALKALINE PHOSPHATASE: 54 U/L (ref 45–115)
ALT (SGPT): 37 U/L (ref <55)
AST (SGOT): 53 U/L — ABNORMAL HIGH (ref 8–48)
BILIRUBIN DIRECT: 0.3 mg/dL — ABNORMAL HIGH (ref <0.3)
BILIRUBIN TOTAL: 0.6 mg/dL (ref 0.3–1.3)
PROTEIN TOTAL: 6 g/dL — ABNORMAL LOW (ref 6.4–8.3)

## 2018-12-22 LAB — BASIC METABOLIC PANEL
ANION GAP: 10 mmol/L (ref 4–13)
BUN/CREA RATIO: 50 — ABNORMAL HIGH (ref 6–22)
BUN: 53 mg/dL — ABNORMAL HIGH (ref 8–25)
CALCIUM: 9 mg/dL (ref 8.5–10.2)
CHLORIDE: 110 mmol/L (ref 96–111)
CO2 TOTAL: 28 mmol/L (ref 22–32)
CREATININE: 1.07 mg/dL (ref 0.62–1.27)
ESTIMATED GFR: >60 mL/min/{1.73_m2} (ref >60)
GLUCOSE: 211 mg/dL — ABNORMAL HIGH (ref 65–139)
POTASSIUM: 3.7 mmol/L (ref 3.5–5.1)
SODIUM: 148 mmol/L — ABNORMAL HIGH (ref 136–145)

## 2018-12-22 LAB — PT/INR
INR: 1.4 — ABNORMAL HIGH (ref 0.80–1.20)
PROTHROMBIN TIME: 16.3 s — ABNORMAL HIGH (ref 9.1–13.9)

## 2018-12-22 LAB — URINALYSIS, MACROSCOPIC
BILIRUBIN: NEGATIVE mg/dL
COLOR: NORMAL
GLUCOSE: 50 mg/dL — AB
KETONES: NEGATIVE mg/dL
LEUKOCYTES: NEGATIVE WBCs/uL
NITRITE: NEGATIVE
PH: 6 (ref 5.0–8.0)
PROTEIN: NEGATIVE mg/dL
SPECIFIC GRAVITY: 1.025 (ref 1.005–1.030)
UROBILINOGEN: NEGATIVE mg/dL

## 2018-12-22 LAB — ARTERIAL BLOOD GAS/LACTATE
%FIO2 (ARTERIAL): 80 %
BASE EXCESS (ARTERIAL): 9.7 mmol/L — ABNORMAL HIGH (ref 0.0–1.0)
BICARBONATE (ARTERIAL): 32.5 mmol/L — ABNORMAL HIGH (ref 18.0–26.0)
LACTATE: 1.8 mmol/L — ABNORMAL HIGH (ref 0.0–1.3)
PAO2/FIO2 RATIO: 103 (ref <=200)
PCO2 (ARTERIAL): 45 mmHg (ref 35.0–45.0)
PH (ARTERIAL): 7.49 — ABNORMAL HIGH (ref 7.35–7.45)
PO2 (ARTERIAL): 82 mmHg (ref 72.0–100.0)

## 2018-12-22 LAB — POC BLOOD GLUCOSE (RESULTS)
GLUCOSE, POC: 205 mg/dL — ABNORMAL HIGH (ref 70–105)
GLUCOSE, POC: 173 mg/dL — ABNORMAL HIGH (ref 70–105)

## 2018-12-22 LAB — PHOSPHORUS: PHOSPHORUS: 2.4 mg/dL (ref 2.4–4.7)

## 2018-12-22 LAB — PTT (PARTIAL THROMBOPLASTIN TIME): APTT: 23.7 s — ABNORMAL LOW (ref 24.2–37.5)

## 2018-12-22 LAB — MAGNESIUM: MAGNESIUM: 2.3 mg/dL (ref 1.6–2.6)

## 2018-12-22 SURGERY — PROCUREMENT MULTIPLE ORGAN
Anesthesia: General

## 2018-12-22 MED ORDER — ELECTROLYTE-A INTRAVENOUS SOLUTION BOLUS
500.0000 mL | Freq: Once | INTRAVENOUS | Status: AC
Start: 2018-12-22 — End: 2018-12-22
  Administered 2018-12-22: 0 mL via INTRAVENOUS
  Administered 2018-12-22: 500 mL via INTRAVENOUS

## 2018-12-22 MED ORDER — HEPARIN (PORCINE) 10,000 UNIT/ML INJECTION SOLUTION
10000.0000 [IU] | Freq: Once | INTRAMUSCULAR | Status: DC
Start: 2018-12-22 — End: 2018-12-22
  Filled 2018-12-22: qty 1

## 2018-12-22 MED ORDER — MORPHINE 2 MG/ML INTRAVENOUS SYRINGE
1.0000 mg | INJECTION | INTRAVENOUS | Status: DC | PRN
Start: 2018-12-22 — End: 2018-12-22
  Filled 2018-12-22: qty 3

## 2018-12-22 MED ORDER — HEPARIN (PORCINE) 10,000 UNIT/ML INJECTION SOLUTION
50000.0000 [IU] | Freq: Once | INTRAMUSCULAR | Status: DC
Start: 2018-12-22 — End: 2018-12-22
  Filled 2018-12-22: qty 5

## 2018-12-22 SURGICAL SUPPLY — 9 items
CONV USE 320027 - CHILDRENS USE 320025 - KIT RM TURNOVER CSTM NONST LF (KITS & TRAYS (DISPOSABLE)) ×1
CONV USE 320027 - CHILDRENS USE 320025 - KIT RM TURNOVER CUSTOM NONST LF (KITS & TRAYS (DISPOSABLE)) ×1 IMPLANT
COVER WAND RFD STRL 50EA/CS_01-0020 (EQUIPMENT MINOR) ×1
COVER WND RF DETECT STRL CLR EQP (EQUIPMENT MINOR) ×1 IMPLANT
KIT ROOM TURNOVER ~~LOC~~ CUSTOM (KITS & TRAYS (DISPOSABLE)) ×1
NEEDLE BIOPSY 14GA 15CM QUICKCORE LTWT BVL STY ETCH TIP RPD FIRE SFT TISS STRL DISP 20MM (NEEDLES & SYRINGE SUPPLIES) ×1 IMPLANT
NEEDLE BIOPSY 14GA 15CM QUICKC_ORE BVLPT STY LTWT SFT TISS (NEEDLES & SYRINGE SUPPLIES) ×1
STRAP POSITION KNEE FOAM SFT ADJ CNTCT CLSR LF (POSITIONING PRODUCTS) ×1 IMPLANT
STRAP POSITION KNEE FOAM SFT A_DJ CNTCT CLSR LF (POSITIONING PRODUCTS) ×1

## 2018-12-22 NOTE — Progress Notes (Signed)
Medical Intensive Care Unit  PROGRESS NOTE     Name: Jon Parrish, [place]., 38 y.o. male Date of service: 12/24/2018   Room: HVI ORPROC/POOL  LOS: 7    MRN: [id number] Attending: No att. providers found   Code Status: Prior Admitted to MICU for:  Severe Anoxic Encephalopathy     SUBJECTIVE  INTERVAL EVENTS:   Jon Schleyer. is a 38 y.o. 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  Currently awaiting OR with CORE. Per CORE today 8/1 family updated and would like an OR date of 9am Monday morning 8/3; CORE working towards that goal with recipient families.    Planned for OR today morning.    OBJECTIVE:    VITAL SIGNS:  Temperature: 37.3 C (99.1 F) BP (Non-Invasive): (!) 39/26 Heart Rate: (!) 0   Respiratory Rate: (!) 0 SpO2: (!) 7 % Weight: 77.5 kg (170 lb 13.7 oz)        INTAKE & OUTPUT:    Intake/Output Summary (Last 24 hours) at 01/15/2019 1747  Last data filed at 01/10/2019 0800  Gross per 24 hour   Intake 2715 ml   Output 1195 ml   Net 1520 ml     Last Bowel Movement: 12/21/18      VENTILATOR SETTINGS:  Conventional settings:  Set VT: 550 mL  Set Rate: 18 Breaths Per Minute  Set PEEP: 12 cmH2O  Pressure Support: 10 cmH2O  FiO2: 80 %     LINES & DRAINS:   Patient Lines/Drains/Airways Status    Active Line / Dialysis Catheter / Dialysis Graft / Drain / Airway / Wound     Name: Placement date: Placement time: Site: Days:    Peripheral IV Left Forearm  11/30/2018   0637   7    Peripheral IV Right;Lower Forearm  12/18/2018   0646   7    Peripheral IV Ultrasound guided;Extended dwell catheter Left Forearm  12/16/18   1404   6    Central Triple Lumen Right Internal Jugular  12/17/18   1816   4    Arterial Line Left Radial  12/17/18   1818   Radial  4    Oral Gastric Tube Center  12/21/18   1200   1    Foley Catheter  12/13/2018   0638   7    Bowel Management System Fecal Incontinence System  12/20/18   1709   2     EndoTracheal Tube Oral 7.5 23 cm Lip  11/22/2018   0500   7                  DAILY EXAMINATION:  Physical Exam:  No change in physical exam since yesterday.  Constitutional: acutely ill, no distress. Intubated  Eyes: Conjunctiva clear., Pupils equal and round. , abnormal findings: pupils non-reactive and pinpoint  ENT: Mouth mucous membranes moist.   Neck: supple, symmetrical, trachea midline  Respiratory: Clear to auscultation bilaterally. , no wheezes. Intubated and mechanically ventilated.  Cardiovascular: regular rate and rhythm, no murmur. No peripheral edema.  Gastrointestinal:  The Soft, non-tender, non-distended  Genitourinary: Foley in place  Musculoskeletal: Head atraumatic and normocephalic.   Integumentary:  Skin warm and dry. Bilateral lower extremity excoriations. Needle track marks upper extremities.       LABS  IMAGING  MICROBIOLOGY  PATHOLOGY:     I have reviewed all of the recent  labs, imaging, microbiology and pathology.    ASSESSMENT:     Jon Haymer. is a 38 y.o. 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      Daily Updates - No significant changes. CORE involved. OR 8/3 planned, TEE, urinalysis, COVID-19 done before surgery on 8/3 09:00 AM.     PLAN:    Neuro   Profound Hypoxic Ischemic Encephalopathy following Cardiac arrest 2/2 Opioid OD.                               With resulting Hypoxic hypercarbic respiratory failure                 -Patient GCS 3 with loss of corneal, gag, cough reflexes                 - Secondary to IVDU and overdose with Utox positive for multiple substances                 - Brain MRI showed findings of extensive hypoxic ischemic encephalopathy.                  -Management per/with CORE                      -Bronchoscopy completed and BAL resulted 8/1 with no organisms and 2+ PMNs, no growth till date.                      Cardio   Troponin  peak 13,522; trended down last 4364 on 7/30   Will get TEE today as per CORE.   NTD at this time     Pulm     Hypoxic hypercarbic respiratory failure 2/2 severe hypoxic encephalopathy    - Pt on SIMV 80% FiO2    - Daily ABG's pH 7.49 today    - Otherwise Mgmt per CORE     GI   Tube feeds, 49m/hr, goal rate 60   Famotidine   Lactulose   Robinul ordered for secretions     Renal   AKI, Cr 1.42 previously; today 8/2, 1.22   LR at 564mhr   Hypokalemia 3.9 on 8/2   Monitor with daily BMP     Endo   Pt on low dose sliding scale lispro.     MSK/Skin   NTD     Infectious Disease    #Fever                  -Related to Neurologic injury / post-cardiac arrest care vs. Infectious.                  -Tylenol PRN                  -Hypo/Hyperthermia blanket                                        -Continue Vancomycin and Cefepime.      Heme/Onc   Hgb 12.4 on 8/1       Prophylaxis:   DVT: Heparin   GI: H2 blocker   Analgesia: None   Nutrition: Tube Feeds   Therapy: PT OT eval ordered   Sedation: none  Family communication/Prognosis:   Prognosis is Poor and decision is made for CMO and OR today for CORE.    De Nurse, MD  Internal Medicine/Pediatrics  [facility]      ------------------------------------------------------------------------------------------------------------------------------------------------  I saw and examined the patient 12/24/2018. I reviewed Dr.Petla's note. I agree with the findings and plan of care as documented in the fellow's/resident's note. Any exceptions/additions are noted above.     Claudie Revering MD  Pulmonary, Critical Care and Sleep Medicine  12/23/2018

## 2018-12-22 NOTE — Care Plan (Signed)
[facility]  Spiritual Care Note    Patient Name:  Jon Parrish.  Date of Encounter:   2019-01-06     2019-01-06 1030   Clinical Encounter Type   Reason for Visit Death   Referral From [place] Visit At This Time No   Visited With Sister;Patient;Mother;Friend;Father  (three sisters, brother in law)   Patient Spiritual Encounters   Spiritual Needs/Issues Unable to assess (comment)  (pt unresponsive)   Other Support Services Provided Non-anxious presence   Family Spiritual Encounters   Spiritual Assessment Grief;Celebration  (celebrating pt's life and decision to donate organs)   Spiritual/Coping Resources Supportive family system;Sense of purpose/meaning;Loved by God/Sacred;Image of God/Sacred;Gratitude;Courage;Connection to faith group;Beliefs in God/Sacred/Higher Purpose;Beliefs helpful in coping   Interpersonal/Family Stressors Substance abuse/recovery within family   Coping  Relationship building;Provided supportive presence;Offered empathy;Facilitated grief;Facilitated story telling;Facilitated meaning making;Explored/supported faith and beliefs;Celebrated with family (comment)  (celebrated pt's life)   Ritual  Patent examiner Provided Non-anxious presence;Consulted with Interdisciplinary Team   Family's Hopes/Goals   Ultimate goal  Joel lives on in others   [place] with Family   Spiritual/Emotional Processing Family connected to spiritual support;Family processed emotions;Family shared/processed patient's story;Spiritual Care relationship established   Family Coping Coping more effectively;More peaceful   Time of Encounters   Start Time 0840   Stop Time 1030   Duration (minutes) 110 Minutes     Other Pertinent Information: I visited with pt's family and friend prior to pt's tx to OR, facilitating storytelling about the pt, "Cork", a nickname given him by his grandmother.  They shared stories, laughing together.  Pt's mother shared that pt had just  recently registered as an organ donor when he received his driver's permit a few months ago, and pt's sister commented "it's like he knew".  I celebrated pt's life with family, and offered prayer for pt and his loved ones per their request.  I provided supportive presence with family before and during their experience in the OR for pt's death, alongside CORE representative Ysidro Evert.  Family actively grieving, supporting one another, making meaning, and coping well.  No further spiritual care needs identified at this time.      Camillia Herter, [facility]  Pager: [phone]  Total Time of Encounter: 120 min.

## 2018-12-22 NOTE — Expiration Summary (Signed)
EXPIRATION SUMMARY        PATIENT NAME:  Jon Parrish, Jon Parrish.   MRN:  [id number]  DOB:  [birth year]    ADMISSION DATE:  2019/01/05  EXPIRATION DATE:  12-Jan-2019      PRIMARY CARE PHYSICIAN: No Pcp     ADMISSION DIAGNOSIS:  Cardiac arrest (CMS HCC) [I46.9]    FINAL DIAGNOSIS:  Cardiac arrest     Principle Problem:  Cardiac arrest    Active Hospital Problems    Diagnosis Date Noted   . Cardiac arrest (CMS [place]) [I46.9] 2019-01-05   . Hypokalemia [E87.6] 01-05-19      Resolved Hospital Problems   No resolved problems to display.     There are no active non-hospital problems to display for this patient.       Code status prior to expiration:  Full Code    REASON FOR HOSPITALIZATION AND HOSPITAL COURSE:     [name] is a 38 y.o. male with PMH s/f IVDUwith profoundhypoxic ischemic encephalopathypost-cardiac arrest(found down,15 minutes of CPR before ROSC likely 2/2 opioid overdose). Brain MRI showed findings of extensive hypoxic ischemic encephalopathy.  Explained the poor prognosis to the family and chose donate his organs. Per CORE OR date 01-12-23 at [facility] Monday morning organ harvest was done and pronounced dead  at 1023 hours on 01/12/19.    cc: Primary Care Physician:  No Pcp  No address on file    [id number] Physician:  Richardean [place], [place] [address]  De Nurse, MD

## 2018-12-22 NOTE — Care Plan (Signed)
VENTILATOR - SIMV(PRVC)PS / APV-SIMV CONTINUOUS    Discontinue   Duration: Until Specified    Priority: Routine       Question Answer Comment   FIO2 (%) 80    Peep(cm/H2O) 12    Rate(bpm) 18    Tidal Volume(mls) 550    Pressure Support(cm/H2O) 10    Indications IMPROVE DISTRIBUTION OF VENTILATION       Pt is currently on vent settings above. He is getting Q3 LRM with vent checks. Will continue to monitor

## 2018-12-22 NOTE — Progress Notes (Signed)
Called to see Jon Parrish. regarding death exam. Patient found to have no visible or audible evidence of spontaneous respiratory effort.  An endotracheal/tracheostomy tube was not present at the time of death.  No electrical, palpable, or audible evidence of spontaneous cardiac activity was present.  Asystole was confirmed in two leads via EKG. No reflexes were present. Patient is pronounced dead at 1023 hours on December 26, 2018. Medical devices present at time of death were left in place pending assessment by the medical examiner.      Medical Examiner Notification: yes  Autopsy per family: yes  CORE notified/present: yes  Family present: yes notified: yes    Electronically signed by:  Renelda Mom, MD  Dec 26, 2018 11:41

## 2019-01-19 LAB — FUNGUS CULTURE

## 2019-01-20 DEATH — deceased
